# Patient Record
Sex: Female | Born: 1995 | Race: White | Hispanic: No | Marital: Single | Smoking: Never smoker
Health system: Southern US, Community
[De-identification: ages and names within clinical notes are randomized; demographics above are authoritative.]

## PROBLEM LIST (undated history)

## (undated) DIAGNOSIS — I10 Essential (primary) hypertension: Secondary | ICD-10-CM

## (undated) DIAGNOSIS — R768 Other specified abnormal immunological findings in serum: Secondary | ICD-10-CM

## (undated) DIAGNOSIS — G43909 Migraine, unspecified, not intractable, without status migrainosus: Secondary | ICD-10-CM

## (undated) DIAGNOSIS — E669 Obesity, unspecified: Secondary | ICD-10-CM

## (undated) DIAGNOSIS — M797 Fibromyalgia: Secondary | ICD-10-CM

## (undated) HISTORY — DX: Essential (primary) hypertension: I10

## (undated) HISTORY — DX: Migraine, unspecified, not intractable, without status migrainosus: G43.909

## (undated) HISTORY — DX: Fibromyalgia: M79.7

## (undated) HISTORY — DX: Obesity, unspecified: E66.9

## (undated) HISTORY — PX: WISDOM TOOTH EXTRACTION: SHX21

## (undated) HISTORY — DX: Other specified abnormal immunological findings in serum: R76.8

---
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
## 2015-03-20 ENCOUNTER — Encounter: Payer: Self-pay | Admitting: Obstetrics & Gynecology

## 2015-03-20 ENCOUNTER — Ambulatory Visit (INDEPENDENT_AMBULATORY_CARE_PROVIDER_SITE_OTHER): Payer: BC Managed Care – PPO | Admitting: Obstetrics & Gynecology

## 2015-03-20 VITALS — BP 124/60 | HR 64 | Resp 12 | Ht 67.5 in | Wt 207.0 lb

## 2015-03-20 DIAGNOSIS — G43119 Migraine with aura, intractable, without status migrainosus: Secondary | ICD-10-CM

## 2015-03-20 DIAGNOSIS — N938 Other specified abnormal uterine and vaginal bleeding: Secondary | ICD-10-CM | POA: Diagnosis not present

## 2015-03-20 DIAGNOSIS — G43109 Migraine with aura, not intractable, without status migrainosus: Secondary | ICD-10-CM | POA: Insufficient documentation

## 2015-03-20 DIAGNOSIS — N946 Dysmenorrhea, unspecified: Secondary | ICD-10-CM | POA: Diagnosis not present

## 2015-03-20 MED ORDER — NORETHINDRONE 0.35 MG PO TABS: 1.0000 | ORAL_TABLET | Freq: Every day | ORAL | Status: DC

## 2015-03-20 NOTE — Progress Notes (Addendum)
19 y.o. G0P0000 SingleCaucasianF here for new patient visit.  I see pt's mother.  Cycles started around age 36.  Cycles have never been regular.  Reports they are better but still not regular.  In the last year, has not gone much longer than 4-5 weeks but sometimes can be about three weeks apart.  Flow is always five days.  First two days are more heavy and then it lightens.  Sometimes will pass clots.  Does have a lot of cramping.    Does have h/o migraines with aura.  Does have visual and sound aura.  Has seen a neurologist at [facility].  Dr. Ulysees Barns.  Accessed Care Everywhere records.  Has 5-7 severe days monthly.  Did have MRI/MRV/MRA for evaluation.  Has done the Gardisil vaccine series.    Home for the summer from [place] in [place].    Patient's last menstrual period was 03/19/2015.          Sexually active: No. never sexually active The current method of family planning is none.    Exercising: Yes.     Smoker:  no  Health Maintenance: Pap:  none History of abnormal Pap:  no MMG:  none Colonoscopy:  none BMD:   none TDaP: UTD Screening Labs: n/a, Hb today: n/a, Urine today: n/a   reports that she has never smoked. She has never used smokeless tobacco. She reports that she does not drink alcohol or use illicit drugs.  Past Medical History  Diagnosis Date  . Migraines     will occ have aura-  . Hypertension     h/o-no medication needed    Past Surgical History  Procedure Laterality Date  . Wisdom tooth extraction      Current Outpatient Prescriptions  Medication Sig Dispense Refill  . baclofen (LIORESAL) 20 MG tablet     . LamoTRIgine (LAMICTAL PO) Take by mouth.  daily    . Magnesium 250 MG TABS Take by mouth daily.    Marland Kitchen VITAMIN D, CHOLECALCIFEROL, PO Take 800 Int'l Units by mouth.     No current facility-administered medications for this visit.    Family History  Problem Relation Age of Onset  . Breast cancer Other     maternal cousins  . Pancreatic  cancer Other     maternal side  . Ovarian cancer Other     maternal side  . Hypertension Mother     and maternal side  . Hypertension Father     and paternal side  . Diabetes Mother     ? if diag or pre-diabetes  . Gestational diabetes Mother   . Diabetes Maternal Grandmother   . Stroke Maternal Grandfather   . Stroke Paternal Grandmother   . Migraines Father   . Migraines Mother     as teenager  . Asthma Brother     sports induced when younger    ROS:  Pertinent items are noted in HPI.  Otherwise, a comprehensive ROS was negative.  Exam:   BP 124/60 mmHg  Pulse 64  Resp 12  Ht 5' 7.5" (1.715 m)  Wt 207 lb (93.895 kg)  BMI 31.92 kg/m2  LMP 03/19/2015   Height: 5' 7.5" (171.5 cm)  Ht Readings from Last 3 Encounters:  03/20/15 5' 7.5" (1.715 m) (90 %*, Z = 1.26)   * Growth percentiles are based on CDC 2-20 Years data.   General appearance: alert, cooperative and appears stated age Head: Normocephalic, without obvious abnormality, atraumatic Neck: no adenopathy,  supple, symmetrical, trachea midline and thyroid normal to inspection and palpation Lungs: clear to auscultation bilaterally Heart: regular rate and rhythm Skin: facial Neurologic: Grossly normal  A:  DUB Dysmenorrhea Migraines with Aura Possible PCOS Acne  P:   Trial of micronor daily.  rx for 1 month with 3RF. Return for fasting insulin, testosterone, and FSH/LH.  May need PUS. Follow up three months

## 2015-03-21 ENCOUNTER — Other Ambulatory Visit (INDEPENDENT_AMBULATORY_CARE_PROVIDER_SITE_OTHER): Payer: BC Managed Care – PPO

## 2015-03-21 DIAGNOSIS — N938 Other specified abnormal uterine and vaginal bleeding: Secondary | ICD-10-CM

## 2015-03-22 LAB — INSULIN, FASTING: INSULIN FASTING, SERUM: 7.6 u[IU]/mL (ref 2.0–19.6)

## 2015-03-22 LAB — FSH/LH
FSH: 3.9 m[IU]/mL
LH: 2.7 m[IU]/mL

## 2015-03-22 LAB — TESTOSTERONE: Testosterone: 35 ng/dL (ref 15–40)

## 2015-04-03 ENCOUNTER — Telehealth: Payer: Self-pay

## 2015-04-03 NOTE — Telephone Encounter (Signed)
-----   Message from Jerene Bears, MD sent at 04/03/2015  6:28 AM EDT ----- Inform pt FSH/LH ratio and values are in normal range.  Tesosterone level normal.  Fasting insulin normal.  Labs do not support diagnosis of PCOS.  She was started on micronor at last visit and should have three month recheck scheduled.  Do not feel needs PUS at this time.

## 2015-04-03 NOTE — Telephone Encounter (Signed)
Left detailed message, ok per DPR, on voicemail. Advised patient that PUS is not needed at this time, but to keep 3 month follow up with Dr Hyacinth Meeker and to call back to go over all lab results.//kn

## 2015-04-11 NOTE — Telephone Encounter (Signed)
Returned call

## 2015-04-14 NOTE — Telephone Encounter (Signed)
Patient notified of all results. See result note.//kn 

## 2015-05-05 ENCOUNTER — Telehealth: Payer: Self-pay | Admitting: Obstetrics & Gynecology

## 2015-05-05 NOTE — Telephone Encounter (Signed)
Pt requesting rescheduling of her 3 month recheck with Dr Hyacinth MeekerMiller. She attends college in [place] and her start date was moved up. Pt needs appointment before 06/04/15. As I am unable to see open appointments prior to that date, pt agreeable to return call from triage.

## 2015-05-05 NOTE — Telephone Encounter (Signed)
Addition to previous note: appointment cancelled and ready for rescheduling.

## 2015-05-05 NOTE — Telephone Encounter (Signed)
Spoke with patient. Patient will return to school 8/17. 3 month recheck appointment moved to 05/22/2015 at 11:15am with Dr.Miller. Patient is agreeable to date and time.  Routing to provider for final review. Patient agreeable to disposition. Will close encounter.   Patient aware provider will review message and nurse will return call if any additional advice or change of disposition.

## 2015-05-22 ENCOUNTER — Encounter: Payer: Self-pay | Admitting: Obstetrics & Gynecology

## 2015-05-22 ENCOUNTER — Ambulatory Visit (INDEPENDENT_AMBULATORY_CARE_PROVIDER_SITE_OTHER): Payer: BC Managed Care – PPO | Admitting: Obstetrics & Gynecology

## 2015-05-22 VITALS — BP 120/78 | HR 82 | Resp 14 | Wt 218.6 lb

## 2015-05-22 DIAGNOSIS — N938 Other specified abnormal uterine and vaginal bleeding: Secondary | ICD-10-CM

## 2015-05-22 MED ORDER — NORETHINDRONE 0.35 MG PO TABS: 1.0000 | ORAL_TABLET | Freq: Every day | ORAL | Status: DC

## 2015-05-22 NOTE — Progress Notes (Signed)
19 y.o. G0P0000 Single CaucasianF here for follow-up after starting Camilla.  Last cycle was 7 days which is longer than normal for her.  First two days were heavy.  Also, she spotted for about a week with a brownish discharge before this cycle started as well.    Pt, otherwise, is without side effects.  Feels headaches are better.    D/w pt options for her including depo provera, Nexplanon, IUD use.  As she is not SA, does not need contraception.  Pt is not a candidate for OCPs with estrogen due to her significant migraines with aura.    Assessment:  DUB, not significantly improved on Micronor  Plan:  Pt desires to stay on current method for another few months.  Rx given to pt as she is going back to school in [place] in the next two weeks.  She will give update in another two months.  If desires change of method, will do this when she returns for Christmas break.

## 2015-06-06 ENCOUNTER — Ambulatory Visit: Payer: BC Managed Care – PPO | Admitting: Obstetrics & Gynecology

## 2015-10-24 DIAGNOSIS — S32000A Wedge compression fracture of unspecified lumbar vertebra, initial encounter for closed fracture: Secondary | ICD-10-CM | POA: Insufficient documentation

## 2015-11-28 DIAGNOSIS — Z9889 Other specified postprocedural states: Secondary | ICD-10-CM | POA: Insufficient documentation

## 2015-12-01 HISTORY — PX: BACK SURGERY: SHX140

## 2016-04-27 ENCOUNTER — Telehealth: Payer: Self-pay

## 2016-04-27 ENCOUNTER — Ambulatory Visit (INDEPENDENT_AMBULATORY_CARE_PROVIDER_SITE_OTHER): Payer: BC Managed Care – PPO | Admitting: Obstetrics & Gynecology

## 2016-04-27 ENCOUNTER — Encounter: Payer: Self-pay | Admitting: Obstetrics & Gynecology

## 2016-04-27 VITALS — BP 116/62 | HR 96 | Resp 14 | Ht 68.0 in | Wt 236.2 lb

## 2016-04-27 DIAGNOSIS — Z3009 Encounter for other general counseling and advice on contraception: Secondary | ICD-10-CM | POA: Diagnosis not present

## 2016-04-27 DIAGNOSIS — N946 Dysmenorrhea, unspecified: Secondary | ICD-10-CM | POA: Diagnosis not present

## 2016-04-27 MED ORDER — MISOPROSTOL 200 MCG PO TABS: ORAL_TABLET | ORAL | Status: DC

## 2016-04-27 MED ORDER — DIAZEPAM 5 MG PO TABS
ORAL_TABLET | ORAL | Status: DC
Start: 2016-04-27 — End: 2016-10-15

## 2016-04-27 NOTE — Progress Notes (Signed)
GYNECOLOGY  VISIT   HPI: 20 y.o. 360P0000 Single Caucasian female here to discuss current OCPs.  Pt has been micronor and, although this has helped, pt would like to consider other options.  She is not SA but desires contraception with whatever option she chooses.  Depo Provera, nexplanon, IUD use reviewed.  Do not feel combination OCPs are a good option due to headache hx--migraines with aura.  [facility].  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  PT really helped.  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  Back started to bother her again.  Had surgery five months ago.  Doing well.  GYNECOLOGIC HISTORY: Patient's last menstrual period was 04/08/2016. Contraception: not SA but also desires contraception with method to improve bleeding.  Patient Active Problem List   Diagnosis Date Noted  . DUB (dysfunctional uterine bleeding) 05/22/2015  . Migraine with aura 03/20/2015  . Headache, migraine 10/21/2014    Past Medical History  Diagnosis Date  . Migraines     will occ have aura-  . Hypertension     h/o-no medication needed    Past Surgical History  Procedure Laterality Date  . Wisdom tooth extraction    . Back surgery  12/01/2015     [facility]     MEDS:  Reviewed in EPIC and UTD  ALLERGIES: Cefzil and Flagyl  Family History  Problem Relation Age of Onset  . Breast cancer Other     maternal cousins  . Pancreatic cancer Other     maternal side  . Ovarian cancer Other     maternal side  . Hypertension Mother     and maternal side  . Hypertension Father     and paternal side  . Diabetes Mother     ? if diag or pre-diabetes  . Gestational diabetes Mother   . Diabetes Maternal Grandmother   . Stroke Maternal Grandfather   . Stroke Paternal Grandmother   . Migraines Father   . Migraines Mother     as teenager  . Asthma Brother     sports induced when younger    SH:  Single, non  smoker  Review of Systems  All other systems reviewed and are negative.   PHYSICAL EXAMINATION:    BP 116/62 mmHg  Pulse 96  Resp 14  Ht 5\' 8"  (1.727 m)  Wt 236 lb 3.2 oz (107.14 kg)  BMI 35.92 kg/m2  LMP 04/08/2016    General appearance: alert, cooperative and appears stated age Neck: no adenopathy, supple, symmetrical, trachea midline and thyroid normal to inspection and palpation CV:  Regular rate and rhythm Lungs:  clear to auscultation, no wheezes, rales or rhonchi, symmetric air entry Abdomen: soft, non-tender; bowel sounds normal; no masses,  no organomegaly  Pelvic:  No exam performed  Chaperone was present for exam.  Assessment: Dysmenorrhea H/O migraines with aura, cannot use combination OCPs  Plan: Return for PUS and Kyleena placement same day, if possible.  Rx for Cytotec 200mcg to be placed PV pm and am before procedure.  Valium rx for 5mg  po x 1, 60 minutes prior to procedure given.   ~20 minutes spent with patient >50% of time was in face to face discussion of above.

## 2016-04-27 NOTE — Patient Instructions (Signed)
Take 800mg  (four tabs) ibuprofen/motrin/advil 60 minutes before procedure.  You can take a valium if you want, 30 minutes to an hour before procedure.

## 2016-04-27 NOTE — Telephone Encounter (Signed)
Spoke with patient. Patient is ready to schedule her ultrasound guided IUD insertion with Dr.Miller. Patient was seen in the office today with Dr.Miller please see OV note. Ultrasound guided IUD insertion scheduled for 05/13/2016 at 1:30-2 pm. Patient is agreeable to date and time. Pre procedure instructions given.  Motrin instructions given. Motrin=Advil=Ibuprofen, 800 mg one hour before appointment. Eat a meal and hydrate well before appointment. Cytotec instructions given. Place 1 tablet PV night before the procedure. Place 1 tablet PV the morning of the procedure. Cytotec rx was previously sent to pharmacy by Dr.Miller.   Routing to provider for final review. Patient agreeable to disposition. Will close encounter.

## 2016-05-05 ENCOUNTER — Telehealth: Payer: Self-pay | Admitting: Behavioral Health

## 2016-05-05 ENCOUNTER — Encounter: Payer: Self-pay | Admitting: Behavioral Health

## 2016-05-05 NOTE — Telephone Encounter (Signed)
Unable to reach patient at time of Pre-Visit Call.  Left message for patient to return call when available.    

## 2016-05-05 NOTE — Addendum Note (Signed)
Addended by: Harold BarbanBYRD, RONECIA E on: 05/05/2016 11:25 AM   Modules accepted: Orders, Medications

## 2016-05-05 NOTE — Telephone Encounter (Signed)
Pre-Visit Call completed with patient and chart updated.   Pre-Visit Info documented in Specialty Comments under SnapShot.    

## 2016-05-06 ENCOUNTER — Ambulatory Visit (INDEPENDENT_AMBULATORY_CARE_PROVIDER_SITE_OTHER): Payer: BC Managed Care – PPO | Admitting: Medical

## 2016-05-06 ENCOUNTER — Encounter: Payer: Self-pay | Admitting: Medical

## 2016-05-06 VITALS — BP 113/59 | HR 87 | Temp 98.2°F | Resp 16 | Ht 68.0 in | Wt 232.8 lb

## 2016-05-06 DIAGNOSIS — R5383 Other fatigue: Secondary | ICD-10-CM

## 2016-05-06 DIAGNOSIS — G43809 Other migraine, not intractable, without status migrainosus: Secondary | ICD-10-CM | POA: Diagnosis not present

## 2016-05-06 DIAGNOSIS — G4700 Insomnia, unspecified: Secondary | ICD-10-CM

## 2016-05-06 DIAGNOSIS — M545 Low back pain: Secondary | ICD-10-CM

## 2016-05-06 DIAGNOSIS — N938 Other specified abnormal uterine and vaginal bleeding: Secondary | ICD-10-CM | POA: Diagnosis not present

## 2016-05-06 LAB — COMPREHENSIVE METABOLIC PANEL
ALBUMIN: 4.6 g/dL (ref 3.5–5.2)
ALT: 20 U/L (ref 0–35)
AST: 17 U/L (ref 0–37)
Alkaline Phosphatase: 74 U/L (ref 39–117)
BILIRUBIN TOTAL: 0.4 mg/dL (ref 0.2–1.2)
BUN: 12 mg/dL (ref 6–23)
CALCIUM: 9.7 mg/dL (ref 8.4–10.5)
CHLORIDE: 105 meq/L (ref 96–112)
CO2: 24 mEq/L (ref 19–32)
CREATININE: 0.72 mg/dL (ref 0.40–1.20)
GFR: 109.18 mL/min (ref >60.00)
Glucose, Bld: 91 mg/dL (ref 70–99)
Potassium: 4.5 mEq/L (ref 3.5–5.1)
Sodium: 139 mEq/L (ref 135–145)
Total Protein: 7.7 g/dL (ref 6.0–8.3)

## 2016-05-06 LAB — TSH: TSH: 0.71 u[IU]/mL (ref 0.35–5.50)

## 2016-05-06 LAB — CBC WITH DIFFERENTIAL/PLATELET
BASOS PCT: 0.3 % (ref 0.0–3.0)
Basophils Absolute: 0 10*3/uL (ref 0.0–0.1)
EOS ABS: 0 10*3/uL (ref 0.0–0.7)
Eosinophils Relative: 0.5 % (ref 0.0–5.0)
HEMATOCRIT: 42.2 % (ref 36.0–46.0)
HEMOGLOBIN: 14.4 g/dL (ref 12.0–15.0)
LYMPHS PCT: 12.9 % (ref 12.0–46.0)
Lymphs Abs: 1.2 10*3/uL (ref 0.7–4.0)
MCHC: 34.2 g/dL (ref 30.0–36.0)
MCV: 86.2 fl (ref 78.0–100.0)
MONOS PCT: 4.9 % (ref 3.0–12.0)
Monocytes Absolute: 0.5 10*3/uL (ref 0.1–1.0)
Neutro Abs: 7.5 10*3/uL (ref 1.4–7.7)
Neutrophils Relative %: 81.4 % — ABNORMAL HIGH (ref 43.0–77.0)
Platelets: 286 10*3/uL (ref 150.0–400.0)
RBC: 4.89 Mil/uL (ref 3.87–5.11)
RDW: 13.5 % (ref 11.5–14.6)
WBC: 9.2 10*3/uL (ref 4.5–10.5)

## 2016-05-06 LAB — T4: T4 TOTAL: 7.7 ug/dL (ref 4.5–12.0)

## 2016-05-06 MED ORDER — ZOLPIDEM TARTRATE 5 MG PO TABS: 5.0000 mg | ORAL_TABLET | Freq: Every evening | ORAL | Status: DC | PRN

## 2016-05-06 NOTE — Progress Notes (Signed)
Pre visit review using our clinic review tool, if applicable. No additional management support is needed unless otherwise documented below in the visit note. 

## 2016-05-06 NOTE — Patient Instructions (Addendum)
For your migraine ha continue to follow up with neurologist and follow their treatment regimen.  For dysfunctional uterine bleeding see gyn and follow their treatment.  For occasional insomnia will rx low dose ambien limited number in event insomnia returns.  For fatigue get cbc, cmp, tsh and t4 studies.   We will call you with results of labs.  Your back pain appears much improved post surgery  Follow up when back in town from [place]. Strongly recommend establishing primary care their as well since many conditions need physical exam and in office treatment. Example being toradol injection for severe persistent migraine.

## 2016-05-06 NOTE — Progress Notes (Addendum)
Subjective:    Patient ID: Erica Aguirre, female    DOB: 05/27/1996, 20 y.o.   MRN: [id number]  HPI   I have reviewed pt PMH, PSH, FH, Social History and Surgical History  Pt is a Consulting civil engineerstudent. She attends [place](pt degree in biology and looking to go to med school). Pt will head back their in 3 weeks. Pt has not been exercising due to back surgery that she is recovering from that was done in feb. Some occasional caffeine beverage about 3 times a month. Pt states eats healthy Single.   LMP- Currently.  Pt states she saw neurologist last week. She get botox every 3 months for her migraines. She states these are helping control severity of HA. Daily ha. Low level and no associated neurologic signs or symptoms.   Pt states she has no primary care in [place]. She states she has schedule of going to school for 4 months then 1 month off. She goes to very small school.   Pt after her surgery. She had some insomnia. Since March and April  Able to sleep. She still has [place]. She uses very sparingly right now.  Pt having iud next week placed. Gyn gave valium before procedure.  Pt had history of back pain in past. Rx of l4 since 2015. Pt had surgery and she reports recent surgery was succesful.(fusion l3-l5).  Pt told MA she wants thyroid testing done. She states always feels cold even if hot outside. Pt states years since.  Pt thinks maybe fatigued. She thinks has grown use to be tired.     Review of Systems  Constitutional: Positive for fatigue. Negative for fever and chills.  Respiratory: Negative for cough, chest tightness, shortness of breath and wheezing.   Cardiovascular: Negative for chest pain and palpitations.  Gastrointestinal: Negative for nausea, vomiting and abdominal pain.  Musculoskeletal: Negative for back pain.  Neurological: Positive for headaches. Negative for dizziness.       Pt states daily ha. Sees neurologst. When she was first worked up by them had imaging studies.    Hematological: Negative for adenopathy. Does not bruise/bleed easily.  Psychiatric/Behavioral: Negative for behavioral problems.    Past Medical History  Diagnosis Date  . Migraines     will occ have aura-  . Hypertension     h/o-no medication needed     Social History   Social History  . Marital Status: Single    Spouse Name: N/A  . Number of Children: N/A  . Years of Education: N/A   Occupational History  . Not on file.   Social History Main Topics  . Smoking status: Never Smoker   . Smokeless tobacco: Never Used  . Alcohol Use: No  . Drug Use: No  . Sexual Activity: No   Other Topics Concern  . Not on file   Social History Narrative    Past Surgical History  Procedure Laterality Date  . Wisdom tooth extraction    . Back surgery  12/01/2015     [facility]     Family History  Problem Relation Age of Onset  . Breast cancer Other     maternal cousins  . Pancreatic cancer Other     maternal side  . Ovarian cancer Other     maternal side  . Hypertension Mother     and maternal side  . Hypertension Father     and paternal side  . Diabetes Mother     ?  if diag or pre-diabetes  . Gestational diabetes Mother   . Diabetes Maternal Grandmother   . Stroke Maternal Grandfather   . Stroke Paternal Grandmother   . Migraines Father   . Migraines Mother     as teenager  . Asthma Brother     sports induced when younger    Allergies  Allergen Reactions  . Cefzil [Cefprozil]     rash  . Flagyl [Metronidazole]     rash    Current Outpatient Prescriptions on File Prior to Visit  Medication Sig Dispense Refill  . acetaminophen (TYLENOL) 325 MG tablet Take by mouth as needed.     . baclofen (LIORESAL) 20 MG tablet     . cyclobenzaprine (FLEXERIL) 10 MG tablet Take 10 mg by mouth daily as needed.    . diazepam (VALIUM) 5 MG tablet Take one tab 30 minutes to one hour before procedure 5 tablet 0  . ibuprofen (ADVIL,MOTRIN) 200 MG tablet  Take by mouth as needed.     . LamoTRIgine (LAMICTAL PO) Take by mouth.  daily    . magnesium gluconate (MAGONATE) 500 MG tablet Take 500 mg by mouth daily.    . misoprostol (CYTOTEC) 200 MCG tablet Place tab pv pm and am before procedure 2 tablet 0  . Multiple Vitamin (MULTIVITAMIN) tablet Take 1 tablet by mouth daily.    . norethindrone (MICRONOR,CAMILA,ERRIN) 0.35 MG tablet Take 1 tablet (0.35 mg total) by mouth daily. 1 Package 12  . VITAMIN D, CHOLECALCIFEROL, PO Take 800 Int'l Units by mouth.    . zolpidem (AMBIEN) 5 MG tablet Take 5 mg by mouth as needed.      No current facility-administered medications on file prior to visit.    BP 113/59 mmHg  Pulse 87  Temp(Src) 98.2 F (36.8 C) (Oral)  Resp 16  Ht  (1.727 m)  Wt 232 lb 12.8 oz (105.597 kg)  BMI 35.41 kg/m2  SpO2 98%  LMP 04/08/2016       Objective:   Physical Exam   General Mental Status- Alert. General Appearance- Not in acute distress.   Skin General: Color- Normal Color. Moisture- Normal Moisture.  Neck Carotid Arteries- Normal color. Moisture- Normal Moisture. No carotid bruits. No JVD.  Chest and Lung Exam Auscultation: Breath Sounds:-Normal.  Cardiovascular Auscultation:Rythm- Regular. Murmurs & Other Heart Sounds:Auscultation of the heart reveals- No Murmurs.  Abdomen Inspection:-Inspeection Normal. Palpation/Percussion:Note:No mass. Palpation and Percussion of the abdomen reveal- Non Tender, Non Distended + BS, no rebound or guarding.   Neurologic Cranial Nerve exam:- CN III-XII intact(No nystagmus), symmetric smile. Strength:- 5/5 equal and symmetric strength both upper and lower extremities.     Assessment & Plan:  For your migraine ha continue to follow up with neurologist and follow their treatment regimen.  For dysfunctional uterine bleeding see gyn and follow their treatment.  For occasional insomnia will rx low dose ambien limited number in event insomnia  returns.  For fatigue get cbc, cmp, tsh and t4 studies.   We will call you with results of labs.  Your back pain appears much improved post surgery  Follow up when back in town from [place]. Strongly recommend establishing primary care their as well since many conditions need physical exam and in office treatment. Example being toradol injection for severe persistent migraine.  Pt noted in 3 wks head back to [place].  Ayden Hardwick, Ramon Dredge, PA-C

## 2016-05-11 NOTE — Progress Notes (Signed)
Pt has seen results on MyChart and message also sent for patient to call back if any questions.

## 2016-05-13 ENCOUNTER — Ambulatory Visit (INDEPENDENT_AMBULATORY_CARE_PROVIDER_SITE_OTHER): Payer: BC Managed Care – PPO

## 2016-05-13 ENCOUNTER — Ambulatory Visit (INDEPENDENT_AMBULATORY_CARE_PROVIDER_SITE_OTHER): Payer: BC Managed Care – PPO | Admitting: Obstetrics & Gynecology

## 2016-05-13 VITALS — BP 116/70 | HR 88

## 2016-05-13 DIAGNOSIS — Z30430 Encounter for insertion of intrauterine contraceptive device: Secondary | ICD-10-CM

## 2016-05-13 DIAGNOSIS — Z3009 Encounter for other general counseling and advice on contraception: Secondary | ICD-10-CM

## 2016-05-13 DIAGNOSIS — N946 Dysmenorrhea, unspecified: Secondary | ICD-10-CM

## 2016-05-13 DIAGNOSIS — N938 Other specified abnormal uterine and vaginal bleeding: Secondary | ICD-10-CM | POA: Diagnosis not present

## 2016-05-13 NOTE — Progress Notes (Signed)
20 y.o. G60P0000 Single Caucasian female here for pelvic ultrasound due to dysmenorrhea and irregular bleeding.  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  Had planned to do this after ultrasound as long as findings are normal.  Pt with hx of migraine with aura.  Pt needs something for cycle control and for possible future contraception needs  LMP:  05/08/16.   Contraception:  Not currently SA  Findings:  UTERUS: 8.4 x 3.6 x 3.2cm EMS: 3.29mm ADNEXA: Left ovary:  2.6 x 1.6 x 1.4cm       Right ovary: 2.6 x 1.6 x 1.7cm CUL DE SAC: no free fluid noted  Discussion:  With normal appearing ultrasound, pt (who is accompanied by her mother) would like IUD placement.  We had previously discussed the [facility] IUD.  She has been counseled about other options for cycle and birth control.  Due to migraines with aura, I do not think estrogen options should be used.  Pt has been counseled about risks and benefits as well as complications.  Consent is obtained today.  All questions answered prior to start of procedure.    ROS  Gen:  WNWF healthy female NAD Abdomen: soft, non-tender Groin:  no inguinal nodes palpated  Pelvic exam: Vulva:  normal female genitalia Vagina:  normal vagina Cervix:  Non-tender, Negative CMT, no lesions or redness. Uterus:  normal shape, position and consistency   Procedure:  Speculum reinserted.  Cervix visualized and cleansed with Betadine x 3.  Paracervical block was not placed.  Single toothed tenaculum applied to anterior lip of cervix without difficulty.  Uterus sounded to 8cm.  IUD package was opened.  IUD and introducer passed to fundus and then withdrawn slightly before IUD was passed into endometrial cavity.  Introducer removed.  Strings cut to 2cm.  Tenaculum removed from cervix.  Minimal bleeding noted.  Pt tolerated the procedure well.  All instruments removed from vagina.   Post procedure examination with ultrasound shows  correct placement  Assessment:  Dysmenorrhea Migraines with aura Placement of Kyleena IUD today  Plan:  Pt will be back in [place] in 6-8 weeks so she knows to call with any concerns over the next few weeks before she goes back to school. Will plan to see her in the fall or winter when she is home for break.  She will need to call and schedule this. Aware IUD removal is due no later than 05/13/2021.  ~15 minutes spent with patient >50% of time was in face to face discussion of ultrasound findings and discussion of options with her mother before proceeding with IUD placement.

## 2016-06-29 ENCOUNTER — Encounter: Payer: Self-pay | Admitting: Medical

## 2016-10-14 NOTE — Progress Notes (Signed)
20 y.o. G0P0000 SingleCaucasianF here for annual exam.  Still having problems with migraines.  [facility].  Has been doing Botox with limited success this year.    [facility].  This should be the last appt related to surgery but will see Dr. Cameron Aliavanaugh on a yearly basis going forward.    Reports cycles are fairly regular.  Last one was about two weeks late.  Pain with cycles are better.  Flow usually lasts four days with the second one being heavy.  Had Kyleena placed 05/13/16.  Patient's last menstrual period was 10/13/2016.          Sexually active: No.  The current method of family planning is IUD.    Exercising: No.  no regular exercise Smoker:  no  Health Maintenance: Pap:  never History of abnormal Pap:  n/a MMG:  never Colonoscopy:  never BMD:   never TDaP:  UTD Pneumonia vaccine(s):  n/a Zostavax:   n/a Hep C testing: not indicated  Screening Labs: not done today, Hb today: not done today, Urine today: unable to void   reports that she has never smoked. She has never used smokeless tobacco. She reports that she does not drink alcohol or use drugs.  Past Medical History:  Diagnosis Date  . Hypertension    h/o-no medication needed  . Migraines    will occ have aura-    Past Surgical History:  Procedure Laterality Date  . BACK SURGERY  12/01/2015    [facility]   . WISDOM TOOTH EXTRACTION      Current Outpatient Prescriptions  Medication Sig Dispense Refill  . acetaminophen (TYLENOL) 325 MG tablet Take by mouth as needed.     . baclofen (LIORESAL) 20 MG tablet     . cyclobenzaprine (FLEXERIL) 10 MG tablet Take 10 mg by mouth daily as needed.    . gabapentin (NEURONTIN) 300 MG capsule Take 300 mg by mouth as needed.    Marland Kitchen. ibuprofen (ADVIL,MOTRIN) 200 MG tablet Take by mouth as needed.     . lamoTRIgine (LAMICTAL) 25 MG tablet Take 3 tablets by mouth daily. 20mg  daily     . magnesium gluconate (MAGONATE)  500 MG tablet Take 500 mg by mouth daily.    . Multiple Vitamin (MULTIVITAMIN) tablet Take 1 tablet by mouth daily.    . predniSONE (DELTASONE) 10 MG tablet Take 1 tablet by mouth daily. For 10 days    . VITAMIN D, CHOLECALCIFEROL, PO Take 800 Int'l Units by mouth.    . zolpidem (AMBIEN) 5 MG tablet Take 1 tablet (5 mg total) by mouth at bedtime as needed. 14 tablet 0   No current facility-administered medications for this visit.     Family History  Problem Relation Age of Onset  . Breast cancer Other     maternal cousins  . Pancreatic cancer Other     maternal side  . Ovarian cancer Other     maternal side  . Hypertension Mother     and maternal side  . Diabetes Mother     ? if diag or pre-diabetes  . Gestational diabetes Mother   . Migraines Mother     as teenager  . Hypertension Father     and paternal side  . Migraines Father   . Diabetes Maternal Grandmother   . Stroke Maternal Grandfather   . Stroke Paternal Grandmother   . Asthma Brother  sports induced when younger    ROS:  Pertinent items are noted in HPI.  Otherwise, a comprehensive ROS was negative.  Exam:   BP 122/70 (BP Location: Right Arm, Patient Position: Sitting, Cuff Size: Normal)   Pulse 92   Resp 16   Ht 5\' 8"  (1.727 m)   Wt 241 lb (109.3 kg)   LMP 10/13/2016   BMI 36.64 kg/m   Weight change: +8#   Height: 5\' 8"  (172.7 cm)  Ht Readings from Last 3 Encounters:  10/15/16 5\' 8"  (1.727 m)  05/06/16 5\' 8"  (1.727 m)  04/27/16 5\' 8"  (1.727 m)    General appearance: alert, cooperative and appears stated age Head: Normocephalic, without obvious abnormality, atraumatic Neck: no adenopathy, supple, symmetrical, trachea midline and thyroid normal to inspection and palpation Lungs: clear to auscultation bilaterally Breasts: normal appearance, no masses or tenderness Heart: regular rate and rhythm Abdomen: soft, non-tender; bowel sounds normal; no masses,  no organomegaly Extremities: extremities  normal, atraumatic, no cyanosis or edema Skin: Skin color, texture, turgor normal. No rashes or lesions Lymph nodes: Cervical, supraclavicular, and axillary nodes normal. No abnormal inguinal nodes palpated Neurologic: Grossly normal  Pelvic: External genitalia:  no lesions              Urethra:  normal appearing urethra with no masses, tenderness or lesions              Bartholins and Skenes: normal                 Vagina: normal appearing vagina with normal color and discharge, no lesions              Cervix: no lesions, IUD string noted              Pap taken: Yes.   Bimanual Exam:  Uterus:  normal size, contour, position, consistency, mobility, non-tender              Adnexa: normal adnexa and no mass, fullness, tenderness               Rectovaginal: Confirms               Anus:  normal sphincter tone, no lesions  Chaperone was present for exam.  A:  Well woman with normal exam IUD use for h/o dysmenorrhea.  Currently not SA. Migraines  P:   Mammogram guidelines reviewed pap smear obtained today Kyleena IUD placed 05/10/16 Seeing ortho yearly Has [facility] Return annually or prn

## 2016-10-15 ENCOUNTER — Ambulatory Visit (INDEPENDENT_AMBULATORY_CARE_PROVIDER_SITE_OTHER): Payer: BC Managed Care – PPO | Admitting: Obstetrics & Gynecology

## 2016-10-15 ENCOUNTER — Encounter: Payer: Self-pay | Admitting: Obstetrics & Gynecology

## 2016-10-15 VITALS — BP 122/70 | HR 92 | Resp 16 | Ht 68.0 in | Wt 241.0 lb

## 2016-10-15 DIAGNOSIS — Z975 Presence of (intrauterine) contraceptive device: Secondary | ICD-10-CM | POA: Insufficient documentation

## 2016-10-15 DIAGNOSIS — Z01419 Encounter for gynecological examination (general) (routine) without abnormal findings: Secondary | ICD-10-CM | POA: Diagnosis not present

## 2016-10-15 DIAGNOSIS — Z124 Encounter for screening for malignant neoplasm of cervix: Secondary | ICD-10-CM | POA: Diagnosis not present

## 2016-10-20 LAB — IPS PAP SMEAR ONLY

## 2017-07-26 ENCOUNTER — Ambulatory Visit (INDEPENDENT_AMBULATORY_CARE_PROVIDER_SITE_OTHER): Payer: BC Managed Care – PPO | Admitting: Medical

## 2017-07-26 ENCOUNTER — Encounter: Payer: Self-pay | Admitting: Medical

## 2017-07-26 VITALS — Ht 68.0 in | Wt 250.0 lb

## 2017-07-26 DIAGNOSIS — R05 Cough: Secondary | ICD-10-CM | POA: Diagnosis not present

## 2017-07-26 DIAGNOSIS — J029 Acute pharyngitis, unspecified: Secondary | ICD-10-CM

## 2017-07-26 DIAGNOSIS — J3089 Other allergic rhinitis: Secondary | ICD-10-CM | POA: Diagnosis not present

## 2017-07-26 DIAGNOSIS — R059 Cough, unspecified: Secondary | ICD-10-CM

## 2017-07-26 LAB — POCT RAPID STREP A (OFFICE): Rapid Strep A Screen: NEGATIVE

## 2017-07-26 MED ORDER — BENZONATATE 100 MG PO CAPS: 100.0000 mg | ORAL_CAPSULE | Freq: Three times a day (TID) | ORAL | 0 refills | Status: DC | PRN

## 2017-07-26 MED ORDER — AZITHROMYCIN 250 MG PO TABS: ORAL_TABLET | ORAL | 0 refills | Status: DC

## 2017-07-26 MED ORDER — LEVOCETIRIZINE DIHYDROCHLORIDE 5 MG PO TABS: 5.0000 mg | ORAL_TABLET | Freq: Every evening | ORAL | 0 refills | Status: DC

## 2017-07-26 MED ORDER — FLUTICASONE PROPIONATE 50 MCG/ACT NA SUSP: 2.0000 | Freq: Every day | NASAL | 1 refills | Status: DC

## 2017-07-26 NOTE — Addendum Note (Signed)
Addended by: Linnell Fulling on: 07/26/2017 01:54 PM   Modules accepted: Orders

## 2017-07-26 NOTE — Patient Instructions (Addendum)
Over the past month, it appears that your nasal congestion, runny nose and sneezing likely related to allergic rhinitis. I will go ahead and prescribe Xyzal antihistamine and Flonase nasal spray.  Your rapid strep test was negative but test can be falsely negative. With your occupation, chills, sweats and physical exam findings I do think it is best to treat you with antibiotic azithromycin. This has good strep coverage.(Some coverage of sinuses as well.)  We'll make benzonatate prescription available for cough as well.  Also on review do recommend that when you get over this recent illness that she flu vaccine.   Follow-up in 7-10 days or as needed.

## 2017-07-26 NOTE — Progress Notes (Signed)
Subjective:    Patient ID: Erica Aguirre, female    DOB: 1996/05/17, 21 y.o.   MRN: [id number]  HPI  Pt in feeling stuffy and runny nose for one month(sneezing over past month). Then this Sunday got st and cough. Pt throat moderate swallowing. Cough is intermittently productive.  Pt has some intermittent chills and sweats but not sure if she had a fever.  No joint pain or muscle pain. 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  LMP- (September 9,2018). Pt states impossible/ indicates not active.     Review of Systems  Constitutional: Negative for chills, fatigue and fever.  HENT: Positive for congestion, postnasal drip, sneezing and sore throat.   Respiratory: Positive for cough. Negative for choking, shortness of breath and wheezing.   Cardiovascular: Negative for chest pain and palpitations.  Gastrointestinal: Negative for abdominal pain, constipation, diarrhea and vomiting.  Musculoskeletal: Negative for back pain, gait problem and neck pain.  Skin: Negative for rash.  Neurological: Negative for dizziness and headaches.  Hematological: Negative for adenopathy. Does not bruise/bleed easily.  Psychiatric/Behavioral: Negative for behavioral problems, confusion, hallucinations and self-injury. The patient is not nervous/anxious and is not hyperactive.     Past Medical History:  Diagnosis Date  . Hypertension    h/o-no medication needed  . Migraines    will occ have aura-     Social History   Social History  . Marital status: Single    Spouse name: N/A  . Number of children: N/A  . Years of education: N/A   Occupational History  . Not on file.   Social History Main Topics  . Smoking status: Never Smoker  . Smokeless tobacco: Never Used  . Alcohol use No  . Drug use: No  . Sexual activity: No   Other Topics Concern  . Not on file   Social History Narrative  . No narrative on file    Past Surgical History:  Procedure Laterality Date   . BACK SURGERY  12/01/2015    L3-L5 fusion done at [facility]   . WISDOM TOOTH EXTRACTION      Family History  Problem Relation Age of Onset  . Breast cancer Other        maternal cousins  . Pancreatic cancer Other        maternal side  . Ovarian cancer Other        maternal side  . Hypertension Mother        and maternal side  . Diabetes Mother        ? if diag or pre-diabetes  . Gestational diabetes Mother   . Migraines Mother        as teenager  . Hypertension Father        and paternal side  . Migraines Father   . Diabetes Maternal Grandmother   . Stroke Maternal Grandfather   . Stroke Paternal Grandmother   . Asthma Brother        sports induced when younger    Allergies  Allergen Reactions  . Cefzil [Cefprozil]     rash  . Flagyl [Metronidazole]     rash    Current Outpatient Prescriptions on File Prior to Visit  Medication Sig Dispense Refill  . acetaminophen (TYLENOL) 325 MG tablet Take by mouth as needed.     . baclofen (LIORESAL) 20 MG tablet     . cyclobenzaprine (FLEXERIL) 10 MG tablet Take 10 mg by mouth daily as needed.    Marland Kitchen  gabapentin (NEURONTIN) 300 MG capsule Take 300 mg by mouth as needed.    Marland Kitchen ibuprofen (ADVIL,MOTRIN) 200 MG tablet Take by mouth as needed.     . lamoTRIgine (LAMICTAL) 25 MG tablet Take 3 tablets by mouth daily.  daily     . magnesium gluconate (MAGONATE) 500 MG tablet Take 500 mg by mouth daily.    . Multiple Vitamin (MULTIVITAMIN) tablet Take 1 tablet by mouth daily.    . predniSONE (DELTASONE) 10 MG tablet Take 1 tablet by mouth daily. For 10 days    . VITAMIN D, CHOLECALCIFEROL, PO Take 800 Int'l Units by mouth.    . zolpidem (AMBIEN) 5 MG tablet Take 1 tablet (5 mg total) by mouth at bedtime as needed. 14 tablet 0   No current facility-administered medications on file prior to visit.     Ht  (1.727 m)   Wt 250 lb (113.4 kg)   LMP 06/26/2017 (Exact Date)   SpO2 97%   BMI 38.01 kg/m       Objective:    Physical Exam  General  Mental Status - Alert. General Appearance - Well groomed. Not in acute distress.  Skin Rashes- No Rashes.  HEENT Head- Normal. Ear Auditory Canal - Left- Normal. Right - Normal.Tympanic Membrane- Left- Normal. Right- Normal. Eye Sclera/Conjunctiva- Left- Normal. Right- Normal. Nose & Sinuses Nasal Mucosa- Left-  Boggy and Congested. Right-  Boggy and  Congested.Bilateral  No maxillary and no frontal sinus pressure. Mouth & Throat Lips: Upper Lip- Normal: no dryness, cracking, pallor, cyanosis, or vesicular eruption. Lower Lip-Normal: no dryness, cracking, pallor, cyanosis or vesicular eruption. Buccal Mucosa- Bilateral- No Aphthous ulcers. Oropharynx- No Discharge or Erythema. Tonsils: Characteristics- Bilateral- faint  Erythema or Congestion. Size/Enlargement- Bilateral- No enlargement. Discharge- bilateral-None.  Neck Neck- Supple. No Masses.   Chest and Lung Exam Auscultation: Breath Sounds:-Clear even and unlabored.  Cardiovascular Auscultation:Rythm- Regular, rate and rhythm. Murmurs & Other Heart Sounds:Ausculatation of the heart reveal- No Murmurs.  Lymphatic Head & Neck General Head & Neck Lymphatics: Bilateral: Description- Faint mild enlarged and tender rt submandibular node.       Assessment & Plan:  Over the past month, it appears that your nasal congestion, runny nose and sneezing likely related to allergic rhinitis. I will go ahead and prescribe Xyzal antihistamine and Flonase nasal spray.  Your rapid strep test was negative but test can be falsely negative. With your occupation, chills, sweats and physical exam findings I do think it is best to treat you with antibiotic azithromycin. This has good strep coverage.(Some coverage of sinuses as well.)  We'll make benzonatate prescription available for cough as well.  Also on review do recommend that when you get over this recent illness that she flu vaccine.   Follow-up in 7-10 days  or as needed.  Winter Jocelyn, Ramon Dredge, PA-C

## 2017-07-27 ENCOUNTER — Ambulatory Visit: Payer: BC Managed Care – PPO | Admitting: Medical

## 2017-07-28 ENCOUNTER — Telehealth: Payer: Self-pay | Admitting: Medical

## 2017-07-28 NOTE — Telephone Encounter (Signed)
Lanier Felty Self [phone]  Anyae called to see if she could possibly get a work note for the rest of the wee, she spike a fever last night and still has it today, and feels it would be best to wait until Monday to go back to work. She works with children.

## 2017-07-28 NOTE — Telephone Encounter (Signed)
Please advise 

## 2017-07-28 NOTE — Telephone Encounter (Signed)
I am okay with her being off until Monday. Would you write that note and I will sign it.

## 2017-07-28 NOTE — Telephone Encounter (Signed)
Pt request work letter be sent to her via email in [place].

## 2017-07-29 NOTE — Telephone Encounter (Signed)
Letter sent to my chart

## 2017-10-04 ENCOUNTER — Ambulatory Visit: Payer: Self-pay
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
## 2017-10-05 ENCOUNTER — Encounter: Payer: Self-pay | Admitting: Medical

## 2017-10-05 ENCOUNTER — Ambulatory Visit: Payer: BC Managed Care – PPO | Admitting: Medical

## 2017-10-05 VITALS — BP 121/73 | HR 89 | Temp 98.2°F | Resp 16 | Ht 68.0 in | Wt 257.6 lb

## 2017-10-05 DIAGNOSIS — R05 Cough: Secondary | ICD-10-CM

## 2017-10-05 DIAGNOSIS — R059 Cough, unspecified: Secondary | ICD-10-CM

## 2017-10-05 MED ORDER — AZELASTINE HCL 0.1 % NA SOLN
2.0000 | Freq: Two times a day (BID) | NASAL | 1 refills | Status: DC
Start: 2017-10-05 — End: 2018-01-04

## 2017-10-05 MED ORDER — BENZONATATE 200 MG PO CAPS: 200.0000 mg | ORAL_CAPSULE | Freq: Three times a day (TID) | ORAL | 0 refills | Status: DC | PRN
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
## 2017-10-05 NOTE — Patient Instructions (Signed)
With your reported dry cough associated with potential smoke exposure and  postnasal drainage on exam, I do think you may have some vasomotor rhinitis.  I do want you to use Astelin nasal spray and I am prescribing benzonatate to suppress cough.  Try to avoid any smoke exposure.  If your symptoms change such as sinus pressure or chest congestion please let me know.  You could my chart me over the holidays and I could send you in an antibiotic if you worsen with bacterial infectious signs or symptoms.    Also is your cough does not improve with above measures would consider getting chest x-ray.  Follow-up in 7-10 days or as needed.

## 2017-10-09 ENCOUNTER — Encounter: Payer: Self-pay | Admitting: Medical

## 2017-10-10 ENCOUNTER — Telehealth: Payer: Self-pay | Admitting: Medical

## 2017-10-10 ENCOUNTER — Telehealth: Payer: Self-pay

## 2017-10-10 MED ORDER — AZITHROMYCIN 250 MG PO TABS: ORAL_TABLET | ORAL | 0 refills | Status: DC

## 2017-10-10 NOTE — Telephone Encounter (Signed)
Patient notified me she was worsening.  Described infectious bronchitis and sinusitis like on my chart.  The day before Christmas and decided to go ahead and send in a azithromycin.

## 2017-10-10 NOTE — Telephone Encounter (Signed)
Prescription of azithromycin sent to patient's pharmacy.

## 2017-10-10 NOTE — Telephone Encounter (Signed)
Copied from CRM [phone]. Topic: General - Other >> Oct 10, 2017  9:39 AM Percival SpanishKennedy, Cheryl W wrote:  Pt call to say Dr Alvira MondaySaguier told her to  call back if she not better and he would call her in a antibiotic  Pharmacy  [facility] [address]

## 2017-10-10 NOTE — Telephone Encounter (Signed)
Relation to pt: self Call back number: [phone]  Pharmacy: [facility] [address] - [place], [place] - [address] [phone] (Phone) [phone])     Reason for call:  Patient last seen 10/05/17 and states PCP advised if symptoms didn't improve PCP would send in antibiotics, patient coughing up phlegm and congested, please advise patient today regarding the status

## 2017-10-12 NOTE — Telephone Encounter (Signed)
Patient picked up rx and is taking, still not feeling much better. I advised her to call us back if she is not feeling better towards the end of rx.

## 2017-10-13 ENCOUNTER — Ambulatory Visit: Payer: BC Managed Care – PPO | Admitting: Family Medicine

## 2017-10-13 ENCOUNTER — Encounter: Payer: Self-pay | Admitting: Family Medicine

## 2017-10-13 ENCOUNTER — Ambulatory Visit (HOSPITAL_BASED_OUTPATIENT_CLINIC_OR_DEPARTMENT_OTHER)
Admission: RE | Admit: 2017-10-13 | Discharge: 2017-10-13 | Disposition: A | Discharge status: Home / Self Care | Payer: BC Managed Care – PPO | Source: Ambulatory Visit | Attending: Family Medicine | Admitting: Family Medicine

## 2017-10-13 VITALS — BP 141/61 | HR 79 | Temp 98.5°F | Ht 68.0 in | Wt 260.0 lb

## 2017-10-13 DIAGNOSIS — R05 Cough: Secondary | ICD-10-CM

## 2017-10-13 DIAGNOSIS — R509 Fever, unspecified: Secondary | ICD-10-CM

## 2017-10-13 DIAGNOSIS — R059 Cough, unspecified: Secondary | ICD-10-CM

## 2017-10-13 MED ORDER — BENZONATATE 100 MG PO CAPS: 100.0000 mg | ORAL_CAPSULE | Freq: Three times a day (TID) | ORAL | 0 refills | Status: DC | PRN

## 2017-10-13 MED ORDER — ALBUTEROL SULFATE HFA 108 (90 BASE) MCG/ACT IN AERS: 2.0000 | INHALATION_SPRAY | Freq: Four times a day (QID) | RESPIRATORY_TRACT | 0 refills | Status: DC | PRN

## 2017-10-13 MED ORDER — HYDROCODONE-HOMATROPINE 5-1.5 MG/5ML PO SYRP: 5.0000 mL | ORAL_SOLUTION | Freq: Three times a day (TID) | ORAL | 0 refills | Status: DC | PRN

## 2017-10-13 NOTE — Progress Notes (Signed)
[facility] [address], [address] [place], [place] [address] [phone] [phone]  Date:  10/13/2017   Name:  Erica HightMarissa Aguirre   DOB:  04/07/1996   MRN:  [id number]  PCP:  Esperanza RichtersSaguier, Edward, PA-C    Chief Complaint: Fever (Onset about 3 to 4 days ago. Running low grade fevers); Cough (Pt has a cough that began about 4 weeks ago that Pt states is progressively getting worse. Dry but now productive); and Nasal Congestion (Onset about 4 days ago. Pt states she's had a stuffy runny nose)   History of Present Illness:  Erica Aguirre is a 21 y.o. very pleasant female patient who presents with the following:  She was here in October with a sore throat-zpack.  She did get well/ back to normal after this  Came back on 12/19-  She thought she might be having a reaction to a fireplace at her job,  She works at [employer] and they have a fireplace there. It seems to smoke a lot and has been irritating her throat. At first the cough was just at work, but then started to become more frequent She noted that she was coughing more over this past weekend and developed a fever on Sunday-  She called in and was given an abx - azithromycin- she will finish this tomorrow   Her main sx are the cough and fever at this time She has continued to run a low grade fever with Tmax 100 over the last few days She has felt hot which is unusual for her- she more often will feel cold compared dto others  She has felt a bit sore but not really achy She is fatigued  She did have a St last week, but this is now resolved She has noted a HA  No vomiting or diarrhea She is generally in good health    Patient Active Problem List   Diagnosis Date Noted  . IUD (intrauterine device) in place 10/15/2016  . Status post lumbar spine operation 11/28/2015  . Closed wedge fracture of lumbar vertebra (HCC) 10/24/2015  . DUB (dysfunctional uterine bleeding) 05/22/2015  . Migraine  with aura 03/20/2015  . Headache, migraine 10/21/2014    Past Medical History:  Diagnosis Date  . Hypertension    h/o-no medication needed  . Migraines    will occ have aura-    Past Surgical History:  Procedure Laterality Date  . BACK SURGERY  12/01/2015    [facility]   . WISDOM TOOTH EXTRACTION      Social History   Tobacco Use  . Smoking status: Never Smoker  . Smokeless tobacco: Never Used  Substance Use Topics  . Alcohol use: No    Alcohol/week: 0.0 oz  . Drug use: No    Family History  Problem Relation Age of Onset  . Breast cancer Other        maternal cousins  . Pancreatic cancer Other        maternal side  . Ovarian cancer Other        maternal side  . Hypertension Mother        and maternal side  . Diabetes Mother        ? if diag or pre-diabetes  . Gestational diabetes Mother   . Migraines Mother        as teenager  . Hypertension Father  and paternal side  . Migraines Father   . Diabetes Maternal Grandmother   . Stroke Maternal Grandfather   . Stroke Paternal Grandmother   . Asthma Brother        sports induced when younger    Allergies  Allergen Reactions  . Cefzil [Cefprozil]     rash  . Flagyl [Metronidazole]     rash    Medication list has been reviewed and updated.  Current Outpatient Medications on File Prior to Visit  Medication Sig Dispense Refill  . acetaminophen (TYLENOL) 325 MG tablet Take by mouth as needed.     Marland Kitchen. azithromycin (ZITHROMAX) 250 MG tablet Take 2 tablets by mouth on day 1, followed by 1 tablet by mouth daily for 4 days. 6 tablet 0  . baclofen (LIORESAL) 20 MG tablet     . benzonatate (TESSALON) 200 MG capsule Take 1 capsule (200 mg total) by mouth 3 (three) times daily as needed for cough. 30 capsule 0  . ibuprofen (ADVIL,MOTRIN) 200 MG tablet Take by mouth as needed.     . lamoTRIgine (LAMICTAL) 25 MG tablet Take 200 mg by mouth daily. 20mg  daily     . Multiple Vitamin  (MULTIVITAMIN) tablet Take 1 tablet by mouth daily.    Marland Kitchen. azelastine (ASTELIN) 0.1 % nasal spray Place 2 sprays into both nostrils 2 (two) times daily. Use in each nostril as directed (Patient not taking: Reported on 10/13/2017) 30 mL 1  . fluticasone (FLONASE) 50 MCG/ACT nasal spray Place 2 sprays into both nostrils daily. (Patient not taking: Reported on 10/13/2017) 16 g 1  . magnesium gluconate (MAGONATE) 500 MG tablet Take 500 mg by mouth daily.    Marland Kitchen. zolpidem (AMBIEN) 5 MG tablet Take 1 tablet (5 mg total) by mouth at bedtime as needed. (Patient not taking: Reported on 10/05/2017) 14 tablet 0   No current facility-administered medications on file prior to visit.     Review of Systems:  As per HPI- otherwise negative.   Physical Examination: Vitals:   10/13/17 1517  BP: (!) 141/61  Pulse: 79  Temp: 98.5 F (36.9 C)  SpO2: 97%   Vitals:   10/13/17 1517  Weight: 260 lb (117.9 kg)  Height: 5\' 8"  (1.727 m)   Body mass index is 39.53 kg/m. Ideal Body Weight: Weight in (lb) to have BMI = 25: 164.1  GEN: WDWN, NAD, Non-toxic, A & O x 3, obese, looks well otherwise  HEENT: Atraumatic, Normocephalic. Neck supple. No masses, No LAD.  Bilateral TM wnl, oropharynx normal.  PEERL,EOMI.   Ears and Nose: No external deformity.   CV: RRR, No M/G/R. No JVD. No thrill. No extra heart sounds. PULM: CTA B, no wheezes, crackles, rhonchi. No retractions. No resp. distress. No accessory muscle use. ABD: S, NT, ND, +BS. No rebound. No HSM. EXTR: No c/c/e NEURO Normal gait.  PSYCH: Normally interactive. Conversant. Not depressed or anxious appearing.  Calm demeanor.   Dg Chest 2 View  Result Date: 10/13/2017 CLINICAL DATA:  Cough.  Low-grade fever. EXAM: CHEST  2 VIEW COMPARISON:  None. FINDINGS: Normal heart size. Normal mediastinal contour. No pneumothorax. No pleural effusion. Lungs appear clear, with no acute consolidative airspace disease and no pulmonary edema. IMPRESSION: No active  cardiopulmonary disease. Electronically Signed   By: Delbert PhenixJason A Poff M.D.   On: 10/13/2017 16:05    Assessment and Plan: Cough - Plan: DG Chest 2 View, benzonatate (TESSALON) 100 MG capsule, HYDROcodone-homatropine (HYCODAN) 5-1.5 MG/5ML syrup, albuterol (PROVENTIL HFA;VENTOLIN  HFA) 108 (90 Base) MCG/ACT inhaler  Low grade fever - Plan: DG Chest 2 View  Here today with a persistent cough.  She has had low grade temps but not a true fever per her report She is finishing up a course of azithromycin She would like something she can take for her cough at night rx for hycodan, and refilled her tessalon perles Albuterol to use for bronchospasm discussed how to use  If not improving over the next few days she will alert me- Sooner if worse.  We might consider a few days of prednisone if cough persists   Signed Abbe Amsterdam, MD  BP Readings from Last 3 Encounters:  10/13/17 (!) 141/61  10/05/17 121/73  10/15/16 122/70

## 2017-10-13 NOTE — Patient Instructions (Addendum)
Your chest x-ray is clear Finish out the azithromycin antibiotic  Use the hycodan syrup as needed for cough, but remember this will make you sleepy Use the albuterol puffer as needed for cough and wheezing Use the tessalon perles as needed for cough during the day If you are not improving please let me know- we can consider a few days of prednisone by mouth

## 2017-10-17 ENCOUNTER — Encounter: Payer: Self-pay | Admitting: Family Medicine

## 2018-01-04 ENCOUNTER — Encounter: Payer: Self-pay | Admitting: Family Medicine

## 2018-01-04 ENCOUNTER — Ambulatory Visit: Payer: BC Managed Care – PPO | Admitting: Family Medicine

## 2018-01-04 ENCOUNTER — Telehealth: Payer: Self-pay

## 2018-01-04 VITALS — BP 112/80 | HR 93 | Temp 98.4°F | Ht 68.0 in | Wt 259.1 lb

## 2018-01-04 DIAGNOSIS — A084 Viral intestinal infection, unspecified: Secondary | ICD-10-CM | POA: Diagnosis not present

## 2018-01-04 MED ORDER — KETOROLAC TROMETHAMINE 60 MG/2ML IM SOLN
60.0000 mg | Freq: Once | INTRAMUSCULAR | Status: AC
  Administered 2018-01-04: 60 mg via INTRAMUSCULAR

## 2018-01-04 MED ORDER — ONDANSETRON HCL 4 MG PO TABS: 4.0000 mg | ORAL_TABLET | Freq: Three times a day (TID) | ORAL | 0 refills | Status: DC | PRN

## 2018-01-04 NOTE — Addendum Note (Signed)
Addended by: Scharlene GlossEWING, Hymen Arnett B on: 01/04/2018 12:02 PM   Modules accepted: Orders

## 2018-01-04 NOTE — Progress Notes (Signed)
Chief Complaint  Patient presents with  . Nasal Congestion  . Nausea  . Diarrhea  . Headache   Subjective Erica Aguirre is a 22 y.o. female who presents with vomiting and diarrhea Symptoms began 3 d. 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 Hypertension    h/o-no medication needed  . Migraines    will occ have aura-   Review of Systems Constitutional:  No fevers or chills Ear/Nose/Mouth/Throat:  No red eyes Gastrointestinal:  As noted in the HPI Musculoskeletal/Extremities: no myalgias Integumentary (Skin/Breast): no rash  Exam BP 112/80 (BP Location: Left Arm, Patient Position: Sitting, Cuff Size: Large)   Pulse 93   Temp 98.4 F (36.9 C) (Oral)   Ht 5\' 8"  (1.727 m)   Wt 259 lb 2 oz (117.5 kg)   SpO2 97%   BMI 39.40 kg/m  General:  well developed, well hydrated, in no apparent distress Skin:  warm, no pallor or diaphoresis, no rashes Throat/Pharynx:  lips and gingiva without lesion; tongue and uvula midline; non-inflamed pharynx; no exudates or postnasal drainage Neck: neck supple without adenopathy, thyromegaly, or masses Lungs:  clear to auscultation, breath sounds equal bilaterally, no respiratory distress, no wheezes Cardio:  regular rate and rhythm without murmurs Abdomen:  abdomen soft, nontender; bowel sounds normal; no masses or organomegaly Extremities:  no clubbing, cyanosis, or edema Psych: Appropriate judgement/insight  Assessment and Plan  Viral gastroenteritis - Plan: ondansetron (ZOFRAN) 4 MG tablet  Orders as above. Push fluids, No NSAIDs for rest of day.  Warning signs and symptoms verbalized and written down in AVS.  F/u if symptoms fail to improve, sooner if worsening. The patient voiced understanding and agreement to the plan.  Jilda Rocheicholas  Paul Chelsea CoveWendling, DO 01/04/18  11:52 AM

## 2018-01-04 NOTE — Progress Notes (Signed)
Pre visit review using our clinic review tool, if applicable. No additional management support is needed unless otherwise documented below in the visit note. 

## 2018-01-04 NOTE — Telephone Encounter (Signed)
PA initiated via Covermymeds; KEY: [id number]. Awaiting determination.

## 2018-01-04 NOTE — Patient Instructions (Signed)
Continue to push fluids, practice good hand hygiene, and cover your mouth if you cough.  If you start having fevers, shaking or worsening symptoms, seek immediate care.  Drink something with electrolytes for as long as you are having diarrhea- like Gatorade, Powerade, Pedialyte.  Let us know if you need anything.

## 2018-01-05 MED ORDER — PROMETHAZINE HCL 6.25 MG/5ML PO SYRP: 6.2500 mg | ORAL_SOLUTION | Freq: Four times a day (QID) | ORAL | 0 refills | Status: DC | PRN

## 2018-01-05 NOTE — Telephone Encounter (Signed)
PA denied. Plan covers medication if patient has hyperemesis gravidarum, is undergoing radiation therapy, or is undergoing moderate to highly emetogenic chemotherapy.

## 2018-01-05 NOTE — Telephone Encounter (Signed)
I will call in phenergan instead. Call pt and see if she is still having issues with nausea. If she is better, please cancel rx. TY.

## 2018-01-05 NOTE — Telephone Encounter (Signed)
Called left message to call back 

## 2018-01-06 NOTE — Telephone Encounter (Signed)
Pt called back.  Pt states she is a little better, as long as she does not eat.  Only able to eat saltine crackers, a small amount of chicken and rice soup.  But other wise still having nausea. Pt also reports on and off diarrhea.

## 2018-01-06 NOTE — Telephone Encounter (Signed)
Called left detailed message of providers information/and to call us back.

## 2018-01-07 NOTE — Telephone Encounter (Signed)
I was too busy on Friday to stop and investigate this cal. Now seeing this on Saturday. Will you call pt on Monday and see how she is? Dr. Carmelia RollerWendling saw her. So I would need fresh update Monday if I were to give further advise. She may need to be seen again?

## 2018-01-09 NOTE — Telephone Encounter (Signed)
Called and informed the patient of all information. She is still having nausea/offered appt today, but she is at work. Scheduled tomorrow with Dahlia ClientEdward S. At 9:45 AM

## 2018-01-09 NOTE — Telephone Encounter (Signed)
I had called in Phenergan should she still be having issues with nausea. If she is still having issues, may need to return to clinic. TY.

## 2018-01-10 ENCOUNTER — Ambulatory Visit: Payer: BC Managed Care – PPO | Admitting: Medical

## 2018-01-10 ENCOUNTER — Telehealth: Payer: Self-pay | Admitting: Medical

## 2018-01-10 ENCOUNTER — Encounter: Payer: Self-pay | Admitting: Medical

## 2018-01-10 VITALS — BP 128/63 | HR 84 | Temp 98.3°F | Resp 16 | Ht 68.0 in | Wt 257.6 lb

## 2018-01-10 DIAGNOSIS — R110 Nausea: Secondary | ICD-10-CM | POA: Diagnosis not present

## 2018-01-10 DIAGNOSIS — K529 Noninfective gastroenteritis and colitis, unspecified: Secondary | ICD-10-CM | POA: Diagnosis not present

## 2018-01-10 MED ORDER — ONDANSETRON 8 MG PO TBDP: 8.0000 mg | ORAL_TABLET | Freq: Three times a day (TID) | ORAL | 0 refills | Status: DC | PRN

## 2018-01-10 NOTE — Progress Notes (Signed)
Subjective:    Patient ID: Erica Aguirre, female    DOB: 06-Mar-1996, 22 y.o.   MRN: [id number]  HPI  Pt in for follow up.  Pt stats she has not been eating. She states has not eaten very much since Saturday. She states diarrhea tapered off around that time.   Pt states no longer has any abdomen pain or cramping.  Pt states her nausea  Is moderate. Has no appetite. Pt was given zofran 4 mg dose and states helps but still feels nausea.   Pt 7 days into symptoms. Most now most of symptoms have resolved.  LMP- Started on Friday. Normal cycle and came when it was expected.    Review of Systems  Constitutional: Negative for chills, fatigue and fever.  HENT: Negative for congestion, ear discharge, nosebleeds, postnasal drip and sore throat.   Respiratory: Negative for cough, chest tightness and wheezing.   Cardiovascular: Negative for chest pain and palpitations.  Gastrointestinal: Positive for nausea. Negative for abdominal distention, abdominal pain, constipation, diarrhea and vomiting.  Genitourinary: Negative for difficulty urinating, enuresis, flank pain and frequency.  Musculoskeletal: Negative for gait problem.  Skin: Negative for rash.  Neurological: Negative for dizziness, numbness and headaches.  Hematological: Negative for adenopathy. Does not bruise/bleed easily.  Psychiatric/Behavioral: Negative for behavioral problems, confusion, hallucinations and self-injury.   Past Medical History:  Diagnosis Date  . Hypertension    h/o-no medication needed  . Migraines    will occ have aura-     Social History   Socioeconomic History  . Marital status: Single    Spouse name: Not on file  . Number of children: Not on file  . Years of education: Not on file  . Highest education level: Not on file  Occupational History  . Not on file  Social Needs  . Financial resource strain: Not on file  . Food insecurity:    Worry: Not on file    Inability: Not on file  .  Transportation needs:    Medical: Not on file    Non-medical: Not on file  Tobacco Use  . Smoking status: Never Smoker  . Smokeless tobacco: Never Used  Substance and Sexual Activity  . Alcohol use: No    Alcohol/week: 0.0 oz  . Drug use: No  . Sexual activity: Never    Partners: Male    Birth control/protection: IUD  Lifestyle  . Physical activity:    Days per week: Not on file    Minutes per session: Not on file  . Stress: Not on file  Relationships  . Social connections:    Talks on phone: Not on file    Gets together: Not on file    Attends religious service: Not on file    Active member of club or organization: Not on file    Attends meetings of clubs or organizations: Not on file    Relationship status: Not on file  . Intimate partner violence:    Fear of current or ex partner: Not on file    Emotionally abused: Not on file    Physically abused: Not on file    Forced sexual activity: Not on file  Other Topics Concern  . Not on file  Social History Narrative  . Not on file    Past Surgical History:  Procedure Laterality Date  . BACK SURGERY  12/01/2015    L3-L5 fusion done at [facility]   . WISDOM TOOTH EXTRACTION  Family History  Problem Relation Age of Onset  . Breast cancer Other        maternal cousins  . Pancreatic cancer Other        maternal side  . Ovarian cancer Other        maternal side  . Hypertension Mother        and maternal side  . Diabetes Mother        ? if diag or pre-diabetes  . Gestational diabetes Mother   . Migraines Mother        as teenager  . Hypertension Father        and paternal side  . Migraines Father   . Diabetes Maternal Grandmother   . Stroke Maternal Grandfather   . Stroke Paternal Grandmother   . Asthma Brother        sports induced when younger    Allergies  Allergen Reactions  . Cefzil [Cefprozil]     rash  . Flagyl [Metronidazole]     rash    Current Outpatient Medications on File Prior to  Visit  Medication Sig Dispense Refill  . acetaminophen (TYLENOL) 325 MG tablet Take by mouth as needed.     . baclofen (LIORESAL) 20 MG tablet     . fluticasone (FLONASE) 50 MCG/ACT nasal spray Place 2 sprays into both nostrils daily. 16 g 1  . lamoTRIgine (LAMICTAL) 25 MG tablet Take 200 mg by mouth daily. 20mg  daily     . magnesium gluconate (MAGONATE) 500 MG tablet Take 500 mg by mouth daily.    . Multiple Vitamin (MULTIVITAMIN) tablet Take 1 tablet by mouth daily.    . promethazine (PHENERGAN) 6.25 MG/5ML syrup Take 5 mLs (6.25 mg total) by mouth every 6 (six) hours as needed for nausea or vomiting. 120 mL 0   No current facility-administered medications on file prior to visit.     BP 128/63   Pulse 84   Temp 98.3 F (36.8 C) (Oral)   Resp 16   Ht 5\' 8"  (1.727 m)   Wt 257 lb 9.6 oz (116.8 kg)   LMP 01/06/2018   SpO2 100%   BMI 39.17 kg/m       Objective:   Physical Exam   General Appearance- Not in acute distress.  HEENT Eyes- Scleraeral/Conjuntiva-bilat- Not Yellow. Mouth & Throat- Normal.  Chest and Lung Exam Auscultation: Breath sounds:-Normal. Adventitious sounds:- No Adventitious sounds.  Cardiovascular Auscultation:Rythm - Regular. Heart Sounds -Normal heart sounds.  Abdomen Inspection:-Inspection Normal.  Palpation/Perucssion: Palpation and Percussion of the abdomen reveal- Non Tender, No Rebound tenderness, No rigidity(Guarding) and No Palpable abdominal masses.  Liver:-Normal.  Spleen:- Normal.   Back- no cva tenderness.  Neurologic-cranial nerves III through XII grossly intact.     Assessment & Plan:  Your gastroenteritis type symptoms appear to be largely resolved.  Have a year nausea is persisting.  I think he would benefit from a higher dose of Zofran and in 3 days with hope/expect that your appetite will start to come back.  Try to advance your diet in 3 days but still avoiding greasy or fried foods.  Also try to avoid meats.  Make sure that  you are keeping well-hydrated as you report over the past 7 days not eating much.  If you would update me via my chart on approximate day for the past 2 how you are tolerating advancing your diet.  Follow-up in 7 to 10 days if needed for any persisting signs or symptoms.  Note if your nausea just lingering despite resolution of all other gastroenteritis type signs and symptoms then would at that point consider initiating lab work-up  Note later during the day when reviewing the note I saw in the chief complaint that staff or CMA put and bad headache.  Patient did not report any headache to me directly during the interview.  So I am going to send a message to medical assistant to call patient tomorrow and inquire whether. she did have headache or currently has headache   Esperanza RichtersEdward Cami Delawder, PA-C.

## 2018-01-10 NOTE — Telephone Encounter (Signed)
After hours when reviewing the note to close, I saw that on the chief complaint he said that headache.  Patient did not mention any headache to me but after seeing just want to know whether or not she has a headache presently or not.  Would you call patient tomorrow and ask her if she had headache yesterday and does she currently have headache?.  Please let me know tomorrow.

## 2018-01-10 NOTE — Patient Instructions (Addendum)
   Your gastroenteritis type symptoms appear to be largely resolved.  Have a year nausea is persisting.  I think he would benefit from a higher dose of Zofran and in 3 days with hope/expect that your appetite will start to come back.  Try to advance your diet in 3 days but still avoiding greasy or fried foods.  Also try to avoid meats.  Make sure that you are keeping well-hydrated as you report over the past 7 days not eating much.  If you would update me via my chart on approximate day for the past 2 how you are tolerating advancing your diet.  Follow-up in 7 to 10 days if needed for any persisting signs or symptoms.  Note if your nausea just lingering despite resolution of all other gastroenteritis type signs and symptoms then would at that point consider initiating lab work-up.

## 2018-01-12 NOTE — Telephone Encounter (Signed)
Pt states she is no longer having headache

## 2018-05-22 ENCOUNTER — Ambulatory Visit: Payer: BC Managed Care – PPO | Admitting: Nurse Practitioner

## 2018-05-22 ENCOUNTER — Encounter: Payer: Self-pay | Admitting: Nurse Practitioner

## 2018-05-22 VITALS — BP 118/78 | HR 92 | Temp 98.8°F | Ht 68.0 in | Wt 255.0 lb

## 2018-05-22 DIAGNOSIS — J0140 Acute pansinusitis, unspecified: Secondary | ICD-10-CM

## 2018-05-22 DIAGNOSIS — J209 Acute bronchitis, unspecified: Secondary | ICD-10-CM

## 2018-05-22 MED ORDER — DM-GUAIFENESIN ER 30-600 MG PO TB12: 1.0000 | ORAL_TABLET | Freq: Two times a day (BID) | ORAL | 0 refills | Status: DC | PRN

## 2018-05-22 MED ORDER — ALBUTEROL SULFATE HFA 108 (90 BASE) MCG/ACT IN AERS
2.0000 | INHALATION_SPRAY | Freq: Four times a day (QID) | RESPIRATORY_TRACT | 2 refills | Status: DC | PRN
Start: 2018-05-22 — End: 2018-12-25

## 2018-05-22 MED ORDER — BENZONATATE 200 MG PO CAPS: 200.0000 mg | ORAL_CAPSULE | Freq: Two times a day (BID) | ORAL | 0 refills | Status: DC | PRN

## 2018-05-22 MED ORDER — SALINE SPRAY 0.65 % NA SOLN: 1.0000 | NASAL | 0 refills | Status: DC | PRN

## 2018-05-22 MED ORDER — HYDROCODONE-HOMATROPINE 5-1.5 MG/5ML PO SYRP: 5.0000 mL | ORAL_SOLUTION | Freq: Every evening | ORAL | 0 refills | Status: DC | PRN

## 2018-05-22 MED ORDER — AZITHROMYCIN 250 MG PO TABS: 250.0000 mg | ORAL_TABLET | Freq: Every day | ORAL | 0 refills | Status: DC

## 2018-05-22 MED ORDER — METHYLPREDNISOLONE ACETATE 40 MG/ML IJ SUSP
40.0000 mg | Freq: Once | INTRAMUSCULAR | Status: AC
  Administered 2018-05-22: 40 mg via INTRAMUSCULAR

## 2018-05-22 NOTE — Progress Notes (Signed)
Subjective:  Patient ID: Erica Aguirre, female    DOB: 07/31/1996  Age: 22 y.o. MRN: [id number]  CC: Cough (patient complaing of sore throat,congestion,dry coughing, hot and cold. this has been going on for 4 days. tried OTC cold and flu,tamaflu,old rx for cough syrup and bezonate--didnt help. )  Cough  This is a new problem. The current episode started in the past 7 days. The problem has been gradually worsening. The problem occurs constantly. The cough is productive of purulent sputum. Associated symptoms include chills, headaches, myalgias, nasal congestion, postnasal drip, rhinorrhea and a sore throat. Pertinent negatives include no fever, shortness of breath or wheezing. The symptoms are aggravated by lying down. She has tried OTC cough suppressant and prescription cough suppressant for the symptoms. The treatment provided mild relief. Her past medical history is significant for bronchitis and environmental allergies.   Reviewed past Medical, Social and Family history today.  Outpatient Medications Prior to Visit  Medication Sig Dispense Refill  . acetaminophen (TYLENOL) 325 MG tablet Take by mouth as needed.     . baclofen (LIORESAL) 20 MG tablet     . lamoTRIgine (LAMICTAL) 25 MG tablet Take 200 mg by mouth daily. 20mg  daily     . magnesium gluconate (MAGONATE) 500 MG tablet Take 500 mg by mouth daily.    . Multiple Vitamin (MULTIVITAMIN) tablet Take 1 tablet by mouth daily.    . fluticasone (FLONASE) 50 MCG/ACT nasal spray Place 2 sprays into both nostrils daily. (Patient not taking: Reported on 05/22/2018) 16 g 1  . ondansetron (ZOFRAN ODT) 8 MG disintegrating tablet Take 1 tablet (8 mg total) by mouth every 8 (eight) hours as needed for nausea or vomiting. (Patient not taking: Reported on 05/22/2018) 20 tablet 0  . promethazine (PHENERGAN) 6.25 MG/5ML syrup Take 5 mLs (6.25 mg total) by mouth every 6 (six) hours as needed for nausea or vomiting. (Patient not taking: Reported on 05/22/2018)  120 mL 0   No facility-administered medications prior to visit.     ROS See HPI  Objective:  BP 118/78   Pulse 92   Temp 98.8 F (37.1 C) (Oral)   Ht 5\' 8"  (1.727 m)   Wt 255 lb (115.7 kg)   SpO2 97%   BMI 38.77 kg/m   BP Readings from Last 3 Encounters:  05/22/18 118/78  01/10/18 128/63  01/04/18 112/80    Wt Readings from Last 3 Encounters:  05/22/18 255 lb (115.7 kg)  01/10/18 257 lb 9.6 oz (116.8 kg)  01/04/18 259 lb 2 oz (117.5 kg)    Physical Exam  Constitutional: She is oriented to person, place, and time.  HENT:  Right Ear: Tympanic membrane, external ear and ear canal normal.  Left Ear: Tympanic membrane, external ear and ear canal normal.  Nose: Mucosal edema and rhinorrhea present. Right sinus exhibits maxillary sinus tenderness. Right sinus exhibits no frontal sinus tenderness. Left sinus exhibits maxillary sinus tenderness.  Mouth/Throat: Uvula is midline. Posterior oropharyngeal erythema present. No oropharyngeal exudate.  Cardiovascular: Normal rate and regular rhythm.  Pulmonary/Chest: Effort normal and breath sounds normal. No stridor. No respiratory distress. She has no wheezes. She has no rales.  Neurological: She is alert and oriented to person, place, and time.  Skin: No rash noted.  Psychiatric: She has a normal mood and affect. Her behavior is normal.  Vitals reviewed.   Lab Results  Component Value Date   WBC 9.2 05/06/2016   HGB 14.4 05/06/2016   HCT 42.2 05/06/2016  PLT 286.0 05/06/2016   GLUCOSE 91 05/06/2016   ALT 20 05/06/2016   AST 17 05/06/2016   NA 139 05/06/2016   K 4.5 05/06/2016   CL 105 05/06/2016   CREATININE 0.72 05/06/2016   BUN 12 05/06/2016   CO2 24 05/06/2016   TSH 0.71 05/06/2016    Dg Chest 2 View  Result Date: 10/13/2017 CLINICAL DATA:  Cough.  Low-grade fever. EXAM: CHEST  2 VIEW COMPARISON:  None. FINDINGS: Normal heart size. Normal mediastinal contour. No pneumothorax. No pleural effusion. Lungs appear  clear, with no acute consolidative airspace disease and no pulmonary edema. IMPRESSION: No active cardiopulmonary disease. Electronically Signed   By: Delbert Phenix M.D.   On: 10/13/2017 16:05    Assessment & Plan:   Labrenda was seen today for cough.  Diagnoses and all orders for this visit:  Acute bronchitis, unspecified organism -     benzonatate (TESSALON) 200 MG capsule; Take 1 capsule (200 mg total) by mouth 2 (two) times daily as needed for cough. -     HYDROcodone-homatropine (HYCODAN) 5-1.5 MG/5ML syrup; Take 5 mLs by mouth at bedtime as needed for cough. -     albuterol (PROVENTIL HFA;VENTOLIN HFA) 108 (90 Base) MCG/ACT inhaler; Inhale 2 puffs into the lungs every 6 (six) hours as needed for wheezing or shortness of breath. -     methylPREDNISolone acetate (DEPO-MEDROL) injection 40 mg -     azithromycin (ZITHROMAX Z-PAK) 250 MG tablet; Take 1 tablet (250 mg total) by mouth daily for 8 days. Take 2tabs on first day, then 1tab once a day till complete -     sodium chloride (OCEAN) 0.65 % SOLN nasal spray; Place 1 spray into both nostrils as needed for congestion. -     dextromethorphan-guaiFENesin (MUCINEX DM) 30-600 MG 12hr tablet; Take 1 tablet by mouth 2 (two) times daily as needed for cough.  Acute non-recurrent pansinusitis -     benzonatate (TESSALON) 200 MG capsule; Take 1 capsule (200 mg total) by mouth 2 (two) times daily as needed for cough. -     HYDROcodone-homatropine (HYCODAN) 5-1.5 MG/5ML syrup; Take 5 mLs by mouth at bedtime as needed for cough. -     albuterol (PROVENTIL HFA;VENTOLIN HFA) 108 (90 Base) MCG/ACT inhaler; Inhale 2 puffs into the lungs every 6 (six) hours as needed for wheezing or shortness of breath. -     methylPREDNISolone acetate (DEPO-MEDROL) injection 40 mg -     azithromycin (ZITHROMAX Z-PAK) 250 MG tablet; Take 1 tablet (250 mg total) by mouth daily for 8 days. Take 2tabs on first day, then 1tab once a day till complete -     sodium chloride  (OCEAN) 0.65 % SOLN nasal spray; Place 1 spray into both nostrils as needed for congestion. -     dextromethorphan-guaiFENesin (MUCINEX DM) 30-600 MG 12hr tablet; Take 1 tablet by mouth 2 (two) times daily as needed for cough.   I have discontinued Christie Sorbello's fluticasone, promethazine, and ondansetron. I am also having her start on benzonatate, HYDROcodone-homatropine, albuterol, azithromycin, sodium chloride, and dextromethorphan-guaiFENesin. Additionally, I am having her maintain her baclofen, lamoTRIgine, acetaminophen, magnesium gluconate, and multivitamin. We will continue to administer methylPREDNISolone acetate.  Meds ordered this encounter  Medications  . benzonatate (TESSALON) 200 MG capsule    Sig: Take 1 capsule (200 mg total) by mouth 2 (two) times daily as needed for cough.    Dispense:  20 capsule    Refill:  0    Order  Specific Question:   Supervising Provider    Answer:   Dianne Dun [[id number]]  . HYDROcodone-homatropine (HYCODAN) 5-1.5 MG/5ML syrup    Sig: Take 5 mLs by mouth at bedtime as needed for cough.    Dispense:  60 mL    Refill:  0    Order Specific Question:   Supervising Provider    Answer:   Dianne Dun [[id number]]  . albuterol (PROVENTIL HFA;VENTOLIN HFA) 108 (90 Base) MCG/ACT inhaler    Sig: Inhale 2 puffs into the lungs every 6 (six) hours as needed for wheezing or shortness of breath.    Dispense:  1 Inhaler    Refill:  2    Order Specific Question:   Supervising Provider    Answer:   Dianne Dun [[id number]]  . methylPREDNISolone acetate (DEPO-MEDROL) injection 40 mg  . azithromycin (ZITHROMAX Z-PAK) 250 MG tablet    Sig: Take 1 tablet (250 mg total) by mouth daily for 8 days. Take 2tabs on first day, then 1tab once a day till complete    Dispense:  6 tablet    Refill:  0    Do not fill before 05/25/2018 or after 06/03/2018    Order Specific Question:   Supervising Provider    Answer:   Dianne Dun [[id number]]  . sodium chloride (OCEAN) 0.65 % SOLN nasal  spray    Sig: Place 1 spray into both nostrils as needed for congestion.    Dispense:  15 mL    Refill:  0    Order Specific Question:   Supervising Provider    Answer:   Dianne Dun [[id number]]  . dextromethorphan-guaiFENesin (MUCINEX DM) 30-600 MG 12hr tablet    Sig: Take 1 tablet by mouth 2 (two) times daily as needed for cough.    Dispense:  14 tablet    Refill:  0    Order Specific Question:   Supervising Provider    Answer:   Dianne Dun [[id number]]    Follow-up: Return if symptoms worsen or fail to improve.  Alysia Penna, NP

## 2018-05-22 NOTE — Patient Instructions (Addendum)
Start azithromycin if no improvement by Thursday 05/25/2018.  Push adequate oral hydration.   Acute Bronchitis, Adult Acute bronchitis is sudden (acute) swelling of the air tubes (bronchi) in the lungs. Acute bronchitis causes these tubes to fill with mucus, which can make it hard to breathe. It can also cause coughing or wheezing. In adults, acute bronchitis usually goes away within 2 weeks. A cough caused by bronchitis may last up to 3 weeks. Smoking, allergies, and asthma can make the condition worse. Repeated episodes of bronchitis may cause further lung problems, such as chronic obstructive pulmonary disease (COPD). What are the causes? This condition can be caused by germs and by substances that irritate the lungs, including:  Cold and flu viruses. This condition is most often caused by the same virus that causes a cold.  Bacteria.  Exposure to tobacco smoke, dust, fumes, and air pollution.  What increases the risk? This condition is more likely to develop in people who:  Have close contact with someone with acute bronchitis.  Are exposed to lung irritants, such as tobacco smoke, dust, fumes, and vapors.  Have a weak immune system.  Have a respiratory condition such as asthma.  What are the signs or symptoms? Symptoms of this condition include:  A cough.  Coughing up clear, yellow, or green mucus.  Wheezing.  Chest congestion.  Shortness of breath.  A fever.  Body aches.  Chills.  A sore throat.  How is this diagnosed? This condition is usually diagnosed with a physical exam. During the exam, your health care provider may order tests, such as chest X-rays, to rule out other conditions. He or she may also:  Test a sample of your mucus for bacterial infection.  Check the level of oxygen in your blood. This is done to check for pneumonia.  Do a chest X-ray or lung function testing to rule out pneumonia and other conditions.  Perform blood tests.  Your  health care provider will also ask about your symptoms and medical history. How is this treated? Most cases of acute bronchitis clear up over time without treatment. Your health care provider may recommend:  Drinking more fluids. Drinking more makes your mucus thinner, which may make it easier to breathe.  Taking a medicine for a fever or cough.  Taking an antibiotic medicine.  Using an inhaler to help improve shortness of breath and to control a cough.  Using a cool mist vaporizer or humidifier to make it easier to breathe.  Follow these instructions at home: Medicines  Take over-the-counter and prescription medicines only as told by your health care provider.  If you were prescribed an antibiotic, take it as told by your health care provider. Do not stop taking the antibiotic even if you start to feel better. General instructions  Get plenty of rest.  Drink enough fluids to keep your urine clear or pale yellow.  Avoid smoking and secondhand smoke. Exposure to cigarette smoke or irritating chemicals will make bronchitis worse. If you smoke and you need help quitting, ask your health care provider. Quitting smoking will help your lungs heal faster.  Use an inhaler, cool mist vaporizer, or humidifier as told by your health care provider.  Keep all follow-up visits as told by your health care provider. This is important. How is this prevented? To lower your risk of getting this condition again:  Wash your hands often with soap and water. If soap and water are not available, use hand sanitizer.  Avoid contact  with people who have cold symptoms.  Try not to touch your hands to your mouth, nose, or eyes.  Make sure to get the flu shot every year.  Contact a health care provider if:  Your symptoms do not improve in 2 weeks of treatment. Get help right away if:  You cough up blood.  You have chest pain.  You have severe shortness of breath.  You become dehydrated.  You  faint or keep feeling like you are going to faint.  You keep vomiting.  You have a severe headache.  Your fever or chills gets worse. This information is not intended to replace advice given to you by your health care provider. Make sure you discuss any questions you have with your health care provider. Document Released: 11/11/2004 Document Revised: 04/28/2016 Document Reviewed: 03/24/2016 Elsevier Interactive Patient Education  [employer]2018 [employer]

## 2018-05-23 ENCOUNTER — Encounter: Payer: Self-pay | Admitting: Nurse Practitioner

## 2018-05-23 ENCOUNTER — Ambulatory Visit (INDEPENDENT_AMBULATORY_CARE_PROVIDER_SITE_OTHER): Payer: BC Managed Care – PPO | Admitting: Nurse Practitioner

## 2018-05-23 VITALS — BP 110/70 | HR 86 | Temp 97.7°F | Ht 68.0 in

## 2018-05-23 DIAGNOSIS — J0140 Acute pansinusitis, unspecified: Secondary | ICD-10-CM | POA: Diagnosis not present

## 2018-05-23 DIAGNOSIS — H6503 Acute serous otitis media, bilateral: Secondary | ICD-10-CM | POA: Diagnosis not present

## 2018-05-23 DIAGNOSIS — J209 Acute bronchitis, unspecified: Secondary | ICD-10-CM | POA: Diagnosis not present

## 2018-05-23 MED ORDER — AZITHROMYCIN 250 MG PO TABS: 250.0000 mg | ORAL_TABLET | Freq: Every day | ORAL | 0 refills | Status: DC

## 2018-05-23 NOTE — Patient Instructions (Addendum)
Please return to office tomorrow for evaluation of right ear with otoscope.  Start azithromycin. Use ibuprofen or tylenol for pain.  Do not insert anything into right ear. You ear is not perforated.  Maintain adequate oral hydration.  Return to office if no improvement in 1week Otitis Media, Adult Otitis media occurs when there is inflammation and fluid in the middle ear. Your middle ear is a part of the ear that contains bones for hearing as well as air that helps send sounds to your brain. What are the causes? This condition is caused by a blockage in the eustachian tube. This tube drains fluid from the ear to the back of the nose (nasopharynx). A blockage in this tube can be caused by an object or by swelling (edema) in the tube. Problems that can cause a blockage include:  A cold or other upper respiratory infection.  Allergies.  An irritant, such as tobacco smoke.  Enlarged adenoids. The adenoids are areas of soft tissue located high in the back of the throat, behind the nose and the roof of the mouth.  A mass in the nasopharynx.  Damage to the ear caused by pressure changes (barotrauma).  What are the signs or symptoms? Symptoms of this condition include:  Ear pain.  A fever.  Decreased hearing.  A headache.  Tiredness (lethargy).  Fluid leaking from the ear.  Ringing in the ear.  How is this diagnosed? This condition is diagnosed with a physical exam. During the exam your health care provider will use an instrument called an otoscope to look into your ear and check for redness, swelling, and fluid. He or she will also ask about your symptoms. Your health care provider may also order tests, such as:  A test to check the movement of the eardrum (pneumatic otoscopy). This test is done by squeezing a small amount of air into the ear.  A test that changes air pressure in the middle ear to check how well the eardrum moves and whether the eustachian tube is working  (tympanogram).  How is this treated? This condition usually goes away on its own within 3-5 days. But if the condition is caused by a bacteria infection and does not go away own its own, or keeps coming back, your health care provider may:  Prescribe antibiotic medicines to treat the infection.  Prescribe or recommend medicines to control pain.  Follow these instructions at home:  Take over-the-counter and prescription medicines only as told by your health care provider.  If you were prescribed an antibiotic medicine, take it as told by your health care provider. Do not stop taking the antibiotic even if you start to feel better.  Keep all follow-up visits as told by your health care provider. This is important. Contact a health care provider if:  You have bleeding from your nose.  There is a lump on your neck.  You are not getting better in 5 days.  You feel worse instead of better. Get help right away if:  You have severe pain that is not controlled with medicine.  You have swelling, redness, or pain around your ear.  You have stiffness in your neck.  A part of your face is paralyzed.  The bone behind your ear (mastoid) is tender when you touch it.  You develop a severe headache. Summary  Otitis media is redness, soreness, and swelling of the middle ear.  This condition usually goes away on its own within 3-5 days.  If  the problem does not go away in 3-5 days, your health care provider may prescribe or recommend medicines to treat your symptoms.  If you were prescribed an antibiotic medicine, take it as told by your health care provider. This information is not intended to replace advice given to you by your health care provider. Make sure you discuss any questions you have with your health care provider. Document Released: 07/09/2004 Document Revised: 09/24/2016 Document Reviewed: 09/24/2016 Elsevier Interactive Patient Education  [employer].

## 2018-05-23 NOTE — Progress Notes (Signed)
Subjective:  Patient ID: Erica Aguirre, female    DOB: 1996/08/18  Age: 22 y.o. MRN: [id number]  CC: Ear Pain (left ear painful when move. feel better since yesterday. )  HPI   Miracle  presents with sudden onset of right ear pain last night, reports hearing a pop followed by decreased hearing and tinnitus. No fever, no neck pain, no dizziness. Denies any head injury Reports improved sinus congestion and cough with use of cough medication and depomedrol IM.  Reviewed past Medical, Social and Family history today.  Outpatient Medications Prior to Visit  Medication Sig Dispense Refill  . acetaminophen (TYLENOL) 325 MG tablet Take by mouth as needed.     Marland Kitchen albuterol (PROVENTIL HFA;VENTOLIN HFA) 108 (90 Base) MCG/ACT inhaler Inhale 2 puffs into the lungs every 6 (six) hours as needed for wheezing or shortness of breath. 1 Inhaler 2  . baclofen (LIORESAL) 20 MG tablet     . benzonatate (TESSALON) 200 MG capsule Take 1 capsule (200 mg total) by mouth 2 (two) times daily as needed for cough. 20 capsule 0  . HYDROcodone-homatropine (HYCODAN) 5-1.5 MG/5ML syrup Take 5 mLs by mouth at bedtime as needed for cough. 60 mL 0  . lamoTRIgine (LAMICTAL) 25 MG tablet Take 200 mg by mouth daily. 20mg  daily     . magnesium gluconate (MAGONATE) 500 MG tablet Take 500 mg by mouth daily.    . Multiple Vitamin (MULTIVITAMIN) tablet Take 1 tablet by mouth daily.    . sodium chloride (OCEAN) 0.65 % SOLN nasal spray Place 1 spray into both nostrils as needed for congestion. 15 mL 0  . [START ON 05/25/2018] azithromycin (ZITHROMAX Z-PAK) 250 MG tablet Take 1 tablet (250 mg total) by mouth daily for 8 days. Take 2tabs on first day, then 1tab once a day till complete (Patient not taking: Reported on 05/23/2018) 6 tablet 0  . dextromethorphan-guaiFENesin (MUCINEX DM) 30-600 MG 12hr tablet Take 1 tablet by mouth 2 (two) times daily as needed for cough. (Patient not taking: Reported on 05/23/2018) 14 tablet 0   No  facility-administered medications prior to visit.     ROS See HPI  Objective:  BP 110/70   Pulse 86   Temp 97.7 F (36.5 C) (Oral)   Ht 5\' 8"  (1.727 m)   SpO2 97%   BMI 38.77 kg/m   BP Readings from Last 3 Encounters:  05/23/18 110/70  05/22/18 118/78  01/10/18 128/63    Wt Readings from Last 3 Encounters:  05/22/18 255 lb (115.7 kg)  01/10/18 257 lb 9.6 oz (116.8 kg)  01/04/18 259 lb 2 oz (117.5 kg)    Physical Exam  HENT:  Right Ear: External ear normal. No drainage. No mastoid tenderness. Tympanic membrane is injected, erythematous and retracted. Tympanic membrane is not perforated. A middle ear effusion is present.  Left Ear: External ear normal. No drainage. No mastoid tenderness. Tympanic membrane is injected, erythematous and retracted. Tympanic membrane is not perforated. A middle ear effusion is present.  Nose: Nose normal.  Mouth/Throat: Oropharynx is clear and moist. No oropharyngeal exudate.  Neck: Normal range of motion. Neck supple.  Lymphadenopathy:    She has cervical adenopathy.    Lab Results  Component Value Date   WBC 9.2 05/06/2016   HGB 14.4 05/06/2016   HCT 42.2 05/06/2016   PLT 286.0 05/06/2016   GLUCOSE 91 05/06/2016   ALT 20 05/06/2016   AST 17 05/06/2016   NA 139 05/06/2016   K 4.5  05/06/2016   CL 105 05/06/2016   CREATININE 0.72 05/06/2016   BUN 12 05/06/2016   CO2 24 05/06/2016   TSH 0.71 05/06/2016    Dg Chest 2 View  Result Date: 10/13/2017 CLINICAL DATA:  Cough.  Low-grade fever. EXAM: CHEST  2 VIEW COMPARISON:  None. FINDINGS: Normal heart size. Normal mediastinal contour. No pneumothorax. No pleural effusion. Lungs appear clear, with no acute consolidative airspace disease and no pulmonary edema. IMPRESSION: No active cardiopulmonary disease. Electronically Signed   By: Delbert PhenixJason A Poff M.D.   On: 10/13/2017 16:05    Assessment & Plan:   Ashok CordiaMarissa was seen today for ear pain.  Diagnoses and all orders for this  visit:  Non-recurrent acute serous otitis media of both ears -     pseudoephedrine-guaifenesin (MUCINEX D) 60-600 MG 12 hr tablet; Take 1 tablet by mouth every 12 (twelve) hours.  Acute bronchitis, unspecified organism -     azithromycin (ZITHROMAX Z-PAK) 250 MG tablet; Take 1 tablet (250 mg total) by mouth daily. Take 2tabs on first day, then 1tab once a day till complete  Acute non-recurrent pansinusitis -     azithromycin (ZITHROMAX Z-PAK) 250 MG tablet; Take 1 tablet (250 mg total) by mouth daily. Take 2tabs on first day, then 1tab once a day till complete -     pseudoephedrine-guaifenesin (MUCINEX D) 60-600 MG 12 hr tablet; Take 1 tablet by mouth every 12 (twelve) hours.   I have discontinued Tawonna Murdy's dextromethorphan-guaiFENesin. I have also changed her azithromycin. Additionally, I am having her start on pseudoephedrine-guaifenesin. Lastly, I am having her maintain her baclofen, lamoTRIgine, acetaminophen, magnesium gluconate, multivitamin, benzonatate, HYDROcodone-homatropine, albuterol, and sodium chloride.  Meds ordered this encounter  Medications  . azithromycin (ZITHROMAX Z-PAK) 250 MG tablet    Sig: Take 1 tablet (250 mg total) by mouth daily. Take 2tabs on first day, then 1tab once a day till complete    Dispense:  6 tablet    Refill:  0    Order Specific Question:   Supervising Provider    Answer:   Dianne DunARON, TALIA M [[id number]]  . pseudoephedrine-guaifenesin (MUCINEX D) 60-600 MG 12 hr tablet    Sig: Take 1 tablet by mouth every 12 (twelve) hours.    Dispense:  6 tablet    Refill:  0    Order Specific Question:   Supervising Provider    Answer:   Dianne DunARON, TALIA M [[id number]]    Follow-up: Return if symptoms worsen or fail to improve.  Alysia Pennaharlotte Jaylia Pettus, NP

## 2018-05-24 ENCOUNTER — Encounter: Payer: Self-pay | Admitting: Nurse Practitioner

## 2018-05-24 MED ORDER — PSEUDOEPHEDRINE-GUAIFENESIN ER 60-600 MG PO TB12: 1.0000 | ORAL_TABLET | Freq: Two times a day (BID) | ORAL | 0 refills | Status: DC

## 2018-05-29 ENCOUNTER — Ambulatory Visit: Payer: BC Managed Care – PPO | Admitting: Nurse Practitioner

## 2018-05-29 ENCOUNTER — Encounter: Payer: Self-pay | Admitting: Nurse Practitioner

## 2018-05-29 VITALS — BP 126/80 | HR 106 | Temp 98.5°F | Ht 68.0 in | Wt 254.2 lb

## 2018-05-29 DIAGNOSIS — H9193 Unspecified hearing loss, bilateral: Secondary | ICD-10-CM | POA: Diagnosis not present

## 2018-05-29 DIAGNOSIS — H6503 Acute serous otitis media, bilateral: Secondary | ICD-10-CM | POA: Diagnosis not present

## 2018-05-29 DIAGNOSIS — J0140 Acute pansinusitis, unspecified: Secondary | ICD-10-CM

## 2018-05-29 MED ORDER — PREDNISONE 10 MG (21) PO TBPK: ORAL_TABLET | ORAL | 0 refills | Status: DC

## 2018-05-29 NOTE — Patient Instructions (Addendum)
Decreased hearing in left ear.  You will be contacted to schedule appt with ENT.  Start oral prednisone as prescribed.  Continue robitussin DM as needed.  Maintain adequate oral hydration.

## 2018-05-29 NOTE — Progress Notes (Signed)
Subjective:  Patient ID: Erica HightMarissa Aguirre, female    DOB: 12/31/1995  Age: 22 y.o. MRN: [id number]  CC: Follow-up (pt c/o ear pain in both ears, left ear worse w/ hearing loss x1week , productive cough, nasal congestion. seen week ago and has not been better)   Otalgia   There is pain in both ears. This is a new problem. The current episode started 1 to 4 weeks ago. The problem occurs constantly. The problem has been unchanged. There has been no fever. Associated symptoms include coughing, hearing loss and rhinorrhea. Pertinent negatives include no abdominal pain, diarrhea, ear discharge, headaches, neck pain, rash, sore throat or vomiting. She has tried antibiotics and NSAIDs for the symptoms. The treatment provided mild relief. There is no history of a chronic ear infection, hearing loss or a tympanostomy tube.   Reviewed past Medical, Social and Family history today.  Outpatient Medications Prior to Visit  Medication Sig Dispense Refill  . acetaminophen (TYLENOL) 325 MG tablet Take by mouth as needed.     Marland Kitchen. albuterol (PROVENTIL HFA;VENTOLIN HFA) 108 (90 Base) MCG/ACT inhaler Inhale 2 puffs into the lungs every 6 (six) hours as needed for wheezing or shortness of breath. 1 Inhaler 2  . baclofen (LIORESAL) 20 MG tablet     . benzonatate (TESSALON) 200 MG capsule Take 1 capsule (200 mg total) by mouth 2 (two) times daily as needed for cough. 20 capsule 0  . HYDROcodone-homatropine (HYCODAN) 5-1.5 MG/5ML syrup Take 5 mLs by mouth at bedtime as needed for cough. 60 mL 0  . ibuprofen (ADVIL,MOTRIN) 200 MG tablet Take by mouth.    . lamoTRIgine (LAMICTAL) 25 MG tablet Take 200 mg by mouth daily. 20mg  daily     . magnesium gluconate (MAGONATE) 500 MG tablet Take 500 mg by mouth daily.    . Multiple Vitamin (MULTIVITAMIN) tablet Take 1 tablet by mouth daily.    . sodium chloride (OCEAN) 0.65 % SOLN nasal spray Place 1 spray into both nostrils as needed for congestion. 15 mL 0  . azithromycin  (ZITHROMAX Z-PAK) 250 MG tablet Take 1 tablet (250 mg total) by mouth daily. Take 2tabs on first day, then 1tab once a day till complete (Patient not taking: Reported on 05/29/2018) 6 tablet 0  . pseudoephedrine-guaifenesin (MUCINEX D) 60-600 MG 12 hr tablet Take 1 tablet by mouth every 12 (twelve) hours. (Patient not taking: Reported on 05/29/2018) 6 tablet 0   No facility-administered medications prior to visit.     ROS See HPI  Objective:  BP 126/80   Pulse (!) 106   Temp 98.5 F (36.9 C) (Oral)   Ht 5\' 8"  (1.727 m)   Wt 254 lb 3.2 oz (115.3 kg)   LMP 05/01/2018   SpO2 97%   BMI 38.65 kg/m   BP Readings from Last 3 Encounters:  05/29/18 126/80  05/23/18 110/70  05/22/18 118/78    Wt Readings from Last 3 Encounters:  05/29/18 254 lb 3.2 oz (115.3 kg)  05/22/18 255 lb (115.7 kg)  01/10/18 257 lb 9.6 oz (116.8 kg)    Physical Exam  Constitutional: No distress.  HENT:  Right Ear: External ear and ear canal normal. Tympanic membrane is erythematous. Tympanic membrane is not injected, not perforated and not bulging. A middle ear effusion is present.  Left Ear: External ear and ear canal normal. Tympanic membrane is erythematous. Tympanic membrane is not injected, not perforated and not bulging. A middle ear effusion is present.  Nose: Mucosal edema  and rhinorrhea present. Right sinus exhibits maxillary sinus tenderness. Right sinus exhibits no frontal sinus tenderness. Left sinus exhibits maxillary sinus tenderness. Left sinus exhibits no frontal sinus tenderness.  Mouth/Throat: Uvula is midline. Posterior oropharyngeal erythema present. No oropharyngeal exudate.  Neck: Normal range of motion. Neck supple.  Cardiovascular: Normal rate.  Pulmonary/Chest: Effort normal.  Lymphadenopathy:    She has no cervical adenopathy.  Skin: No rash noted.  Vitals reviewed.   Lab Results  Component Value Date   WBC 9.2 05/06/2016   HGB 14.4 05/06/2016   HCT 42.2 05/06/2016   PLT  286.0 05/06/2016   GLUCOSE 91 05/06/2016   ALT 20 05/06/2016   AST 17 05/06/2016   NA 139 05/06/2016   K 4.5 05/06/2016   CL 105 05/06/2016   CREATININE 0.72 05/06/2016   BUN 12 05/06/2016   CO2 24 05/06/2016   TSH 0.71 05/06/2016    Dg Chest 2 View  Result Date: 10/13/2017 CLINICAL DATA:  Cough.  Low-grade fever. EXAM: CHEST  2 VIEW COMPARISON:  None. FINDINGS: Normal heart size. Normal mediastinal contour. No pneumothorax. No pleural effusion. Lungs appear clear, with no acute consolidative airspace disease and no pulmonary edema. IMPRESSION: No active cardiopulmonary disease. Electronically Signed   By: Delbert PhenixJason A Poff M.D.   On: 10/13/2017 16:05    Assessment & Plan:   Erica Aguirre was seen today for follow-up.  Diagnoses and all orders for this visit:  Non-recurrent acute serous otitis media of both ears -     predniSONE (STERAPRED UNI-PAK 21 TAB) 10 MG (21) TBPK tablet; As directed on package -     Ambulatory referral to ENT  Acute non-recurrent pansinusitis -     predniSONE (STERAPRED UNI-PAK 21 TAB) 10 MG (21) TBPK tablet; As directed on package  Bilateral hearing loss, unspecified hearing loss type -     Hearing screening -     Ambulatory referral to ENT   I am having Erica Aguirre start on predniSONE. I am also having her maintain her baclofen, lamoTRIgine, acetaminophen, magnesium gluconate, multivitamin, benzonatate, HYDROcodone-homatropine, albuterol, sodium chloride, azithromycin, pseudoephedrine-guaifenesin, and ibuprofen.  Meds ordered this encounter  Medications  . predniSONE (STERAPRED UNI-PAK 21 TAB) 10 MG (21) TBPK tablet    Sig: As directed on package    Dispense:  21 tablet    Refill:  0    Order Specific Question:   Supervising Provider    Answer:   MATTHEWS, CODY [[id number]]   Follow-up: No follow-ups on file.  Erica Pennaharlotte Kirrah Mustin, NP

## 2018-12-25 ENCOUNTER — Ambulatory Visit: Payer: BC Managed Care – PPO | Admitting: Family Medicine

## 2018-12-25 ENCOUNTER — Encounter: Payer: Self-pay | Admitting: Family Medicine

## 2018-12-25 VITALS — BP 112/74 | Temp 98.9°F | Ht 68.0 in | Wt 250.0 lb

## 2018-12-25 DIAGNOSIS — J069 Acute upper respiratory infection, unspecified: Secondary | ICD-10-CM | POA: Insufficient documentation

## 2018-12-25 DIAGNOSIS — A084 Viral intestinal infection, unspecified: Secondary | ICD-10-CM | POA: Diagnosis not present

## 2018-12-25 NOTE — Assessment & Plan Note (Signed)
Likely viral. No temp greater than 100.4 and no body aches  - counseled on supportive care - given indications to follow up.

## 2018-12-25 NOTE — Assessment & Plan Note (Signed)
Symptoms seem viral. 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  - counseled on supportive care - can try imodium  - provided work note until symptom free for 24 hours  - f/u if symptoms last longer than one week. May need to perform stool studies or lab work.

## 2018-12-25 NOTE — Progress Notes (Signed)
Erica Aguirre - 23 y.o. female MRN [id number]  Date of birth: August 18, 1996  SUBJECTIVE:  Including CC & ROS.  Chief Complaint  Patient presents with  . Diarrhea    x3 days  . Sore Throat    x2 days  . Cough  . Nasal Congestion    Erica Aguirre is a 23 y.o. female that is presenting with diarrhea, cough, and congestion.  The diarrhea started on Friday.  She is having more than 10 bowel movements per day.  It is watery and nonbloody in nature.  Denies any new or different foods.  Denies any travel.  Denies any new or different medications.  Has not tried any Imodium.  Is unable to tolerate many foods by mouth.  Does have some nausea.  No rashes.  Denies any oral ulcers.  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  She also works at [employer].  The sinus congestion and cough started on Saturday and on Sunday.  The recorded temperature was 100 degrees.  Denies any myalgias.  Did not receive the flu vaccine.  Has not had any exposure to anyone with similar symptoms.    Review of Systems  HENT: Positive for congestion and sore throat.   Respiratory: Positive for cough.   Cardiovascular: Negative for chest pain.  Gastrointestinal: Positive for diarrhea and nausea. Negative for abdominal pain.  Musculoskeletal: Negative for back pain.  Skin: Negative for color change.    HISTORY: Past Medical, Surgical, Social, and Family History Reviewed & Updated per EMR.   Pertinent Historical Findings include:  Past Medical History:  Diagnosis Date  . Hypertension    h/o-no medication needed  . Migraines    will occ have aura-    Past Surgical History:  Procedure Laterality Date  . BACK SURGERY  12/01/2015    L3-L5 fusion done at [facility]   . WISDOM TOOTH EXTRACTION      Allergies  Allergen Reactions  . Metronidazole     rash rash   . Cefprozil Rash    rash  . Other Rash    [id number]    Family History  Problem Relation Age of Onset  .  Breast cancer Other        maternal cousins  . Pancreatic cancer Other        maternal side  . Ovarian cancer Other        maternal side  . Hypertension Mother        and maternal side  . Diabetes Mother        ? if diag or pre-diabetes  . Gestational diabetes Mother   . Migraines Mother        as teenager  . Hypertension Father        and paternal side  . Migraines Father   . Diabetes Maternal Grandmother   . Stroke Maternal Grandfather   . Stroke Paternal Grandmother   . Asthma Brother        sports induced when younger     Social History   Socioeconomic History  . Marital status: Single    Spouse name: Not on file  . Number of children: Not on file  . Years of education: Not on file  . Highest education level: Not on file  Occupational History  . Not on file  Social Needs  . Financial resource strain: Not on file  . Food insecurity:    Worry: Not on file    Inability:  Not on file  . Transportation needs:    Medical: Not on file    Non-medical: Not on file  Tobacco Use  . Smoking status: Never Smoker  . Smokeless tobacco: Never Used  Substance and Sexual Activity  . Alcohol use: No    Alcohol/week: 0.0 standard drinks  . Drug use: No  . Sexual activity: Never    Partners: Male    Birth control/protection: I.U.D.  Lifestyle  . Physical activity:    Days per week: Not on file    Minutes per session: Not on file  . Stress: Not on file  Relationships  . Social connections:    Talks on phone: Not on file    Gets together: Not on file    Attends religious service: Not on file    Active member of club or organization: Not on file    Attends meetings of clubs or organizations: Not on file    Relationship status: Not on file  . Intimate partner violence:    Fear of current or ex partner: Not on file    Emotionally abused: Not on file    Physically abused: Not on file    Forced sexual activity: Not on file  Other Topics Concern  . Not on file  Social  History Narrative  . Not on file     PHYSICAL EXAM:  VS: BP 112/74   Temp 98.9 F (37.2 C) (Oral)   Ht 5\' 8"  (1.727 m)   Wt 250 lb (113.4 kg)   BMI 38.01 kg/m  Physical Exam Gen: NAD, alert, cooperative with exam, ENT: normal lips, normal nasal mucosa, tympanic membranes clear and intact bilaterally, normal oropharynx, no cervical lymphadenopathy Eye: normal EOM, normal conjunctiva and lids CV:  no edema, +2 pedal pulses, regular rate and rhythm, S1-S2   Resp: no accessory muscle use, non-labored, clear to auscultation bilaterally, no crackles or wheezes GI: no masses or tenderness, no hernia, soft, +BS, non distended,  Skin: no rashes, no areas of induration  Neuro: normal tone, normal sensation to touch Psych:  normal insight, alert and oriented MSK: Normal gait, normal strength       ASSESSMENT & PLAN:   Upper respiratory tract infection Likely viral. No temp greater than 100.4 and no body aches  - counseled on supportive care - given indications to follow up.   Viral gastroenteritis Symptoms seem viral. 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  - counseled on supportive care - can try imodium  - provided work note until symptom free for 24 hours  - f/u if symptoms last longer than one week. May need to perform stool studies or lab work.

## 2018-12-25 NOTE — Patient Instructions (Signed)
Nice to meet you  Please try to stay well hydrated  Please try imodium  Please try eating bland foods  Please follow up if your symptoms last longer than 7 days.

## 2018-12-28 ENCOUNTER — Telehealth: Payer: Self-pay | Admitting: Medical

## 2018-12-28 MED ORDER — AZITHROMYCIN 250 MG PO TABS
ORAL_TABLET | ORAL | 0 refills | Status: DC
Start: 2018-12-28 — End: 2019-06-27

## 2018-12-28 NOTE — Telephone Encounter (Signed)
Spoke with patient. Will send in azithro for possible sinusitis. Diarrhea has cleared.   Myra Rude, MD [facility] 12/28/2018, 2:23 PM

## 2018-12-28 NOTE — Telephone Encounter (Signed)
Copied from CRM [id number]. Topic: General - Other >> Dec 28, 2018 10:52 AM Floria Raveling A wrote: Reason for CRM: pt called in an stated she seen Dr Jordan Likes on Monday.  This is a [facility] pt but was seen at [facility] on Monday.  She stated Dr schmitz told him to call back if she wasn't any better.  Pt stated that she is still nauseas and is super congested.  She would like to know if there is anything that he could call in for her   Pharmacy [facility] [address] [place], [place] - [address] [phone] (Phone Pt states that she now would like something for her nausea

## 2018-12-28 NOTE — Telephone Encounter (Signed)
Dr. Schmitz please advise? 

## 2019-01-23 ENCOUNTER — Emergency Department (HOSPITAL_COMMUNITY)
Admission: EM | Admit: 2019-01-23 | Discharge: 2019-01-24 | Disposition: A | Discharge status: Home / Self Care | Payer: BC Managed Care – PPO | Attending: Emergency Medicine | Admitting: Emergency Medicine

## 2019-01-23 DIAGNOSIS — Z79899 Other long term (current) drug therapy: Secondary | ICD-10-CM | POA: Insufficient documentation

## 2019-01-23 DIAGNOSIS — F10920 Alcohol use, unspecified with intoxication, uncomplicated: Secondary | ICD-10-CM | POA: Insufficient documentation

## 2019-01-23 DIAGNOSIS — T508X5A Adverse effect of diagnostic agents, initial encounter: Secondary | ICD-10-CM | POA: Insufficient documentation

## 2019-01-23 DIAGNOSIS — R1032 Left lower quadrant pain: Secondary | ICD-10-CM | POA: Diagnosis present

## 2019-01-23 DIAGNOSIS — R0789 Other chest pain: Secondary | ICD-10-CM | POA: Insufficient documentation

## 2019-01-23 DIAGNOSIS — R112 Nausea with vomiting, unspecified: Secondary | ICD-10-CM | POA: Diagnosis not present

## 2019-01-23 DIAGNOSIS — R064 Hyperventilation: Secondary | ICD-10-CM | POA: Diagnosis not present

## 2019-01-23 DIAGNOSIS — Y906 Blood alcohol level of 120-199 mg/100 ml: Secondary | ICD-10-CM | POA: Diagnosis not present

## 2019-01-23 DIAGNOSIS — R1012 Left upper quadrant pain: Secondary | ICD-10-CM | POA: Diagnosis not present

## 2019-01-23 DIAGNOSIS — L500 Allergic urticaria: Secondary | ICD-10-CM | POA: Diagnosis not present

## 2019-01-23 DIAGNOSIS — Z0441 Encounter for examination and observation following alleged adult rape: Secondary | ICD-10-CM | POA: Insufficient documentation

## 2019-01-23 LAB — CBC WITH DIFFERENTIAL/PLATELET
Abs Immature Granulocytes: 0.07 10*3/uL (ref 0.00–0.07)
Basophils Absolute: 0 10*3/uL (ref 0.0–0.1)
Basophils Relative: 0 %
Eosinophils Absolute: 0 10*3/uL (ref 0.0–0.5)
Eosinophils Relative: 0 %
HCT: 43.5 % (ref 36.0–46.0)
Hemoglobin: 15 g/dL (ref 12.0–15.0)
Immature Granulocytes: 1 %
Lymphocytes Relative: 17 %
Lymphs Abs: 1.8 10*3/uL (ref 0.7–4.0)
MCH: 29.6 pg (ref 26.0–34.0)
MCHC: 34.5 g/dL (ref 30.0–36.0)
MCV: 85.8 fL (ref 80.0–100.0)
Monocytes Absolute: 0.7 10*3/uL (ref 0.1–1.0)
Monocytes Relative: 6 %
Neutro Abs: 8.2 10*3/uL — ABNORMAL HIGH (ref 1.7–7.7)
Neutrophils Relative %: 76 %
Platelets: 296 10*3/uL (ref 150–400)
RBC: 5.07 MIL/uL (ref 3.87–5.11)
RDW: 13.2 % (ref 11.5–15.5)
WBC: 10.8 10*3/uL — ABNORMAL HIGH (ref 4.0–10.5)
nRBC: 0 % (ref 0.0–0.2)

## 2019-01-23 MED ORDER — SODIUM CHLORIDE 0.9 % IV BOLUS
1000.0000 mL | Freq: Once | INTRAVENOUS | Status: AC
  Administered 2019-01-23: 1000 mL via INTRAVENOUS

## 2019-01-23 MED ORDER — LORAZEPAM 2 MG/ML IJ SOLN
0.5000 mg | Freq: Once | INTRAMUSCULAR | Status: AC
  Administered 2019-01-23: 0.5 mg via INTRAVENOUS
  Filled 2019-01-23: qty 1

## 2019-01-23 MED ORDER — ONDANSETRON HCL 4 MG/2ML IJ SOLN
4.0000 mg | Freq: Once | INTRAMUSCULAR | Status: AC
  Administered 2019-01-23: 4 mg via INTRAVENOUS
  Filled 2019-01-23: qty 2
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
## 2019-01-23 NOTE — ED Notes (Signed)
Pt slightly desating after Ativan administration. 2 L [place] applied. Pt's sat at 100% now

## 2019-01-24 ENCOUNTER — Emergency Department (HOSPITAL_COMMUNITY): Payer: BC Managed Care – PPO

## 2019-01-24 ENCOUNTER — Ambulatory Visit (HOSPITAL_COMMUNITY)
Admission: EM | Admit: 2019-01-24 | Discharge: 2019-01-24 | Disposition: A | Discharge status: Home / Self Care | Payer: No Typology Code available for payment source | Attending: Emergency Medicine | Admitting: Emergency Medicine

## 2019-01-24 ENCOUNTER — Encounter (HOSPITAL_COMMUNITY): Payer: Self-pay | Admitting: Radiology

## 2019-01-24 ENCOUNTER — Telehealth: Payer: Self-pay | Admitting: Medical

## 2019-01-24 DIAGNOSIS — Z0441 Encounter for examination and observation following alleged adult rape: Secondary | ICD-10-CM | POA: Insufficient documentation

## 2019-01-24 LAB — RAPID HIV SCREEN (HIV 1/2 AB+AG)
HIV 1/2 Antibodies: NONREACTIVE
HIV-1 P24 Antigen - HIV24: NONREACTIVE

## 2019-01-24 LAB — COMPREHENSIVE METABOLIC PANEL
ALT: 28 U/L (ref 0–44)
AST: 19 U/L (ref 15–41)
Albumin: 4.1 g/dL (ref 3.5–5.0)
Alkaline Phosphatase: 84 U/L (ref 38–126)
Anion gap: 13 (ref 5–15)
BUN: 9 mg/dL (ref 6–20)
CO2: 16 mmol/L — ABNORMAL LOW (ref 22–32)
Calcium: 8.7 mg/dL — ABNORMAL LOW (ref 8.9–10.3)
Chloride: 108 mmol/L (ref 98–111)
Creatinine, Ser: 0.64 mg/dL (ref 0.44–1.00)
GFR calc Af Amer: >60 mL/min (ref >60)
GFR calc non Af Amer: >60 mL/min (ref >60)
Glucose, Bld: 89 mg/dL (ref 70–99)
Potassium: 3.5 mmol/L (ref 3.5–5.1)
Sodium: 137 mmol/L (ref 135–145)
Total Bilirubin: 0.4 mg/dL (ref 0.3–1.2)
Total Protein: 7.1 g/dL (ref 6.5–8.1)

## 2019-01-24 LAB — ETHANOL: Alcohol, Ethyl (B): 166 mg/dL — ABNORMAL HIGH (ref <10)

## 2019-01-24 LAB — RPR: RPR Ser Ql: NONREACTIVE

## 2019-01-24 LAB — POC URINE PREG, ED: Preg Test, Ur: NEGATIVE

## 2019-01-24 MED ORDER — LIDOCAINE HCL (PF) 1 % IJ SOLN
0.9000 mL | Freq: Once | INTRAMUSCULAR | Status: DC
  Filled 2019-01-24: qty 5

## 2019-01-24 MED ORDER — GENTAMICIN SULFATE 40 MG/ML IJ SOLN
240.0000 mg | Freq: Once | INTRAVENOUS | Status: AC
  Administered 2019-01-24: 240 mg via INTRAVENOUS
  Filled 2019-01-24: qty 6

## 2019-01-24 MED ORDER — DIPHENHYDRAMINE HCL 50 MG/ML IJ SOLN: 25.0000 mg | Freq: Once | INTRAMUSCULAR | Status: DC

## 2019-01-24 MED ORDER — ELVITEG-COBIC-EMTRICIT-TENOFAF 150-150-200-10 MG PO TABS: 1.0000 | ORAL_TABLET | Freq: Every day | ORAL | 0 refills | Status: DC

## 2019-01-24 MED ORDER — DIPHENHYDRAMINE HCL 50 MG/ML IJ SOLN
25.0000 mg | Freq: Once | INTRAMUSCULAR | Status: AC
  Administered 2019-01-24: 25 mg via INTRAVENOUS
  Filled 2019-01-24: qty 1

## 2019-01-24 MED ORDER — ULIPRISTAL ACETATE 30 MG PO TABS
30.0000 mg | ORAL_TABLET | Freq: Once | ORAL | Status: AC
  Administered 2019-01-24: 30 mg via ORAL
  Filled 2019-01-24: qty 1

## 2019-01-24 MED ORDER — GENTAMICIN SULFATE 40 MG/ML IJ SOLN
240.0000 mg | Freq: Once | INTRAMUSCULAR | Status: DC
  Filled 2019-01-24: qty 6

## 2019-01-24 MED ORDER — ELVITEG-COBIC-EMTRICIT-TENOFAF 150-150-200-10 MG PREPACK
5.0000 | ORAL_TABLET | Freq: Once | ORAL | Status: AC
  Administered 2019-01-24: 5 via ORAL
  Filled 2019-01-24: qty 5

## 2019-01-24 MED ORDER — ELVITEG-COBIC-EMTRICIT-TENOFAF 150-150-200-10 MG PREPACK
ORAL_TABLET | ORAL | Status: AC
  Filled 2019-01-24: qty 1

## 2019-01-24 MED ORDER — AZITHROMYCIN 250 MG PO TABS
1000.0000 mg | ORAL_TABLET | Freq: Once | ORAL | Status: AC
  Administered 2019-01-24: 1000 mg via ORAL
  Filled 2019-01-24: qty 4

## 2019-01-24 MED ORDER — IOHEXOL 300 MG/ML  SOLN
100.0000 mL | Freq: Once | INTRAMUSCULAR | Status: AC | PRN
  Administered 2019-01-24: 100 mL via INTRAVENOUS

## 2019-01-24 MED FILL — GENVOYA TABLET: 150-150-200 | 30 days supply | Qty: 30 | Fill #0
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
## 2019-01-24 NOTE — Telephone Encounter (Signed)
Pt evaluated in ED recently. Would you reach out to her and offer virtual visit.

## 2019-01-24 NOTE — ED Notes (Signed)
SANE RN with patient. 

## 2019-01-24 NOTE — ED Notes (Signed)
UA sent
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
## 2019-01-24 NOTE — Discharge Instructions (Signed)
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 zolpidem -certain medicines for blood pressure or heart disease like amlodipine, diltiazem, felodipine, metoprolol, nicardipine, nifedipine, timolol, verapamil -certain medicines for depression, anxiety, or psychiatric disturbances -certain medicines for erectile dysfunction like avanafil, sildenafil, tadalafil, vardenafil -certain medicines for fungal infection like itraconazole, ketoconazole, voriconazole -colchicine -cyclosporine -dexamethasone -female hormones, like estrogens and progestins and birth control pills -fluticasone -medicines for infection like acyclovir, cidofovir, valacyclovir, ganciclovir, valganciclovir -medicines for irregular heart beat like amiodarone, bepridil, digoxin, disopyramide, dofetilide, flecainide, lidocaine, mexiletine, propafenone, quinidine -metformin -oxcarbazepine -phenothiazines like  perphenazine, risperidone, thioridazine -salmeterol -sirolimus -tacrolimus -warfarin This list may not describe all possible interactions. Give your health care provider a list of all the medicines, herbs, non-prescription drugs, or dietary supplements you use. Also tell them if you smoke, drink alcohol, or use illegal drugs. Some items may interact  with your medicine.  What should I watch for while using this medicine? Visit your doctor or health care professional for regular check ups. Discuss any new symptoms with your doctor. You will need to have important blood work done while on this medicine. HIV is spread to others through sexual or blood contact. Talk to your doctor about how to stop the spread of HIV. If you have hepatitis B, talk to your doctor if you plan to stop this medicine. The symptoms of hepatitis B may get worse if you stop this medicine. Birth control pills may not work properly while you are taking this medicine. Talk to your doctor about using an extra method of birth control. Women who can still have children must use a reliable form of barrier contraception, like a condom.  What side effects may I notice from receiving this medicine? Side effects that you should report to your doctor or health care professional as soon as possible: -allergic reactions like skin rash, itching or hives, swelling of the face, lips, or tongue -breathing problems -fast, irregular heartbeat -muscle pain or weakness -signs and symptoms of kidney injury like trouble passing urine or change in the amount of urine -signs and symptoms of liver injury like dark yellow or brown urine; general ill feeling or flu-like symptoms; light-colored stools; loss of appetite; right upper belly pain; unusually weak or tired; yellowing of the eyes or skin Side effects that usually do not require medical attention (report to your doctor or health care professional if they continue or are  bothersome): -diarrhea -headache -nausea -tiredness This list may not describe all possible side effects. Call your doctor for medical advice about side effects. You may report side effects to FDA at 1-800-FDA-1088.  Where should I keep my medicine? Keep out of the reach of children. Store at room temperature below 30 degrees C (86 degrees F). Throw away any unused medicine after the expiration date. NOTE: This sheet is a summary. It may not cover all possible information. If you have questions about this medicine, talk to your doctor, pharmacist, or health care provider.  2018 Elsevier/Gold Standard (2016-07-19 12:54:04)
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
## 2019-01-24 NOTE — Telephone Encounter (Signed)
Left message on pt's voicemail to check mychart acct. Message sent. 

## 2019-01-25 LAB — HEPATITIS C ANTIBODY: HCV Ab: <0.1 s/co ratio (ref 0.0–0.9)

## 2019-01-25 LAB — HEPATITIS PANEL, ACUTE
HCV Ab: <0.1 s/co ratio (ref 0.0–0.9)
Hep A IgM: NEGATIVE
Hep B C IgM: NEGATIVE
Hepatitis B Surface Ag: NEGATIVE

## 2019-06-26 ENCOUNTER — Other Ambulatory Visit: Payer: Self-pay

## 2019-06-27 ENCOUNTER — Encounter: Payer: Self-pay | Admitting: Obstetrics & Gynecology

## 2019-06-27 ENCOUNTER — Ambulatory Visit: Payer: BC Managed Care – PPO | Admitting: Obstetrics & Gynecology

## 2019-06-27 ENCOUNTER — Other Ambulatory Visit (HOSPITAL_COMMUNITY)
Admission: RE | Admit: 2019-06-27 | Discharge: 2019-06-27 | Disposition: A | Discharge status: Home / Self Care | Payer: BC Managed Care – PPO | Source: Ambulatory Visit | Attending: Obstetrics & Gynecology | Admitting: Obstetrics & Gynecology

## 2019-06-27 VITALS — BP 130/76 | HR 84 | Temp 98.8°F | Ht 68.0 in | Wt 235.0 lb

## 2019-06-27 DIAGNOSIS — Z124 Encounter for screening for malignant neoplasm of cervix: Secondary | ICD-10-CM | POA: Insufficient documentation

## 2019-06-27 DIAGNOSIS — Z113 Encounter for screening for infections with a predominantly sexual mode of transmission: Secondary | ICD-10-CM

## 2019-06-27 DIAGNOSIS — Z01419 Encounter for gynecological examination (general) (routine) without abnormal findings: Secondary | ICD-10-CM | POA: Diagnosis not present

## 2019-06-27 DIAGNOSIS — Z23 Encounter for immunization: Secondary | ICD-10-CM | POA: Diagnosis not present

## 2019-06-27 MED ORDER — NORETHINDRONE 0.35 MG PO TABS: 1.0000 | ORAL_TABLET | Freq: Every day | ORAL | 3 refills | Status: DC

## 2019-06-27 NOTE — Progress Notes (Signed)
23 y.o. G0P0000 Single White or Caucasian female here for annual exam.  She had an ER visit in April.  Now confidential patient.  Doesn't really want to talk about it.  She cycles every months.  Flow lasts about 7 days.  Reports her cycles got much better for the past three years.  In the last few months, she's having more issues with nausea, cramping, low back pain and headaches.  She actually stayed in bed last months.    Was released from Dr. Gerome Apley, ortho, last year in September.  Neuruology:  Dr. Devoria Albe  Patient's last menstrual period was 06/08/2019 (exact date).          Sexually active: No.  The current method of family planning is IUD.  Kyleena placed 05/13/16  Exercising: Yes.    walking Smoker:  no  Health Maintenance: Pap:  10/15/16 Neg  History of abnormal Pap:  no MMG:  Never TDaP:  due Gardasil: completed  Hep C testing: 01/24/19 Neg Screening Labs: PCP   reports that she has never smoked. She has never used smokeless tobacco. She reports that she does not drink alcohol or use drugs.  Past Medical History:  Diagnosis Date  . Hypertension    h/o-no medication needed  . Migraines    will occ have aura-    Past Surgical History:  Procedure Laterality Date  . BACK SURGERY  12/01/2015    L3-L5 fusion done at [facility]   . WISDOM TOOTH EXTRACTION      Current Outpatient Medications  Medication Sig Dispense Refill  . acetaminophen (TYLENOL) 325 MG tablet Take by mouth as needed.     . baclofen (LIORESAL) 20 MG tablet     . Cholecalciferol (VITAMIN D3) 10 MCG (400 UNIT) CAPS Take by mouth.    Marland Kitchen ibuprofen (ADVIL,MOTRIN) 200 MG tablet Take by mouth.    . lamoTRIgine (LAMICTAL) 200 MG tablet Take 1 tablet by mouth daily.    . magnesium gluconate (MAGONATE) 500 MG tablet Take 500 mg by mouth daily.    . Multiple Vitamin (MULTIVITAMIN) tablet Take 1 tablet by mouth daily.     No current facility-administered medications for this visit.     Family History   Problem Relation Age of Onset  . Breast cancer Other        maternal cousins  . Pancreatic cancer Other        maternal side  . Ovarian cancer Other        maternal side  . Hypertension Mother        and maternal side  . Diabetes Mother        ? if diag or pre-diabetes  . Gestational diabetes Mother   . Migraines Mother        as teenager  . Hypertension Father        and paternal side  . Migraines Father   . Diabetes Maternal Grandmother   . Stroke Maternal Grandfather   . Stroke Paternal Grandmother   . Asthma Brother        sports induced when younger    Review of Systems  Genitourinary: Positive for menstrual problem (nausea, headaches, diarrhea, cramping).  All other systems reviewed and are negative.   Exam:   BP 130/76   Pulse 84   Temp 98.8 F (37.1 C) (Temporal)   Ht 5\' 8"  (1.727 m)   Wt 235 lb (106.6 kg)   LMP 06/08/2019 (Exact Date)   BMI 35.73 kg/m  Height: 5\' 8"  (172.7 cm)  Ht Readings from Last 3 Encounters:  06/27/19 5\' 8"  (1.727 m)  12/25/18 5\' 8"  (1.727 m)  05/29/18 5\' 8"  (1.727 m)    General appearance: alert, cooperative and appears stated age Head: Normocephalic, without obvious abnormality, atraumatic Neck: no adenopathy, supple, symmetrical, trachea midline and thyroid normal to inspection and palpation Lungs: clear to auscultation bilaterally Breasts: normal appearance, no masses or tenderness Heart: regular rate and rhythm Abdomen: soft, non-tender; bowel sounds normal; no masses,  no organomegaly Extremities: extremities normal, atraumatic, no cyanosis or edema Skin: Skin color, texture, turgor normal. No rashes or lesions Lymph nodes: Cervical, supraclavicular, and axillary nodes normal. No abnormal inguinal nodes palpated Neurologic: Grossly normal   Pelvic: External genitalia:  no lesions              Urethra:  normal appearing urethra with no masses, tenderness or lesions              Bartholins and Skenes: normal                  Vagina: normal appearing vagina with normal color and discharge, no lesions              Cervix: no lesions              Pap taken: Yes.   Bimanual Exam:  Uterus:  normal size, contour, position, consistency, mobility, non-tender              Adnexa: normal adnexa and no mass, fullness, tenderness               Rectovaginal: Confirms               Anus:  normal sphincter tone, no lesions  Chaperone was present for exam.  A:  Well Woman with normal exam Dysmenorrhea, worse over last few months H/o Kyleena placement 2017 Migraines with aura, cannot use estrogen products  P:   Mammogram guidelines reviewed. pap smear obtained today with Gc/Chl, trich testing Trial of micronor 1 po q day.  #3 months supply/4RF.  If helps, can stay on this with IUD.  If not, consider PUS and replacment of Kyleena or with Mirena.  Pt will call and give update in two to three months. Tdap updated today. No lab work obtained today. return annually or prn

## 2019-06-29 LAB — CYTOLOGY - PAP
Adequacy: ABSENT
Chlamydia: NEGATIVE
Diagnosis: NEGATIVE
Neisseria Gonorrhea: NEGATIVE
Trichomonas: NEGATIVE

## 2019-08-28 IMAGING — CT CT ABDOMEN AND PELVIS WITH CONTRAST
2 of 4 series · 17 of 46 positions shown, 19 images · IV contrast (omnipaque)
Comparison: None available.

CLINICAL DATA: Initial evaluation for acute nausea, vomiting, left
flank pain.

EXAM:
CT ABDOMEN AND PELVIS WITH CONTRAST
TECHNIQUE: Multidetector CT imaging of the abdomen and pelvis was performed
using the standard protocol following bolus administration of
intravenous contrast.
CONTRAST:  100mL OMNIPAQUE IOHEXOL 300 MG/ML  SOLN

[Series 3: abdomen 5.0 · axial · 0.97mm/px · z∈[+618,+1074]mm · 14 of 103 slices shown, 16 images]
[im 6/103  soft-tissue]
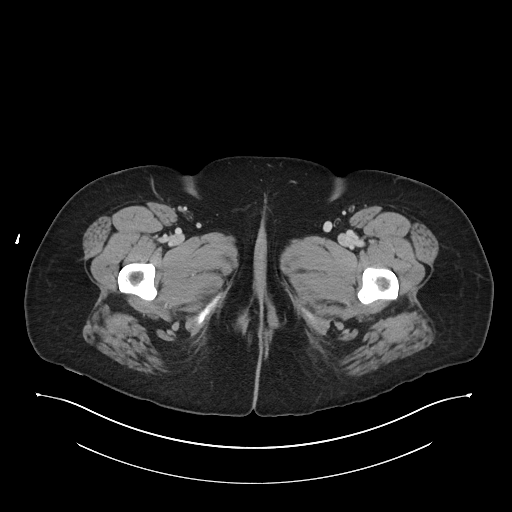
[im 6/103  bone]
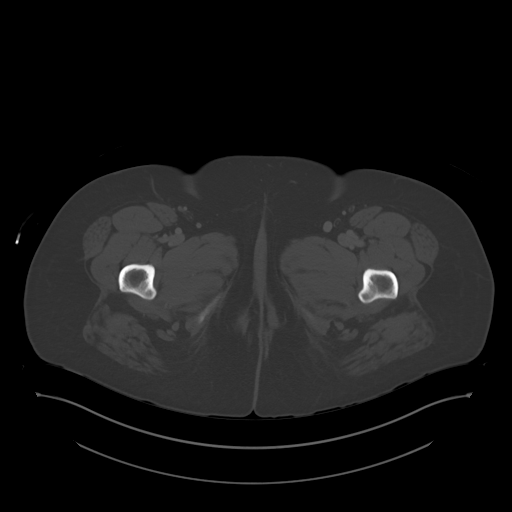
[im 11/103  soft-tissue]
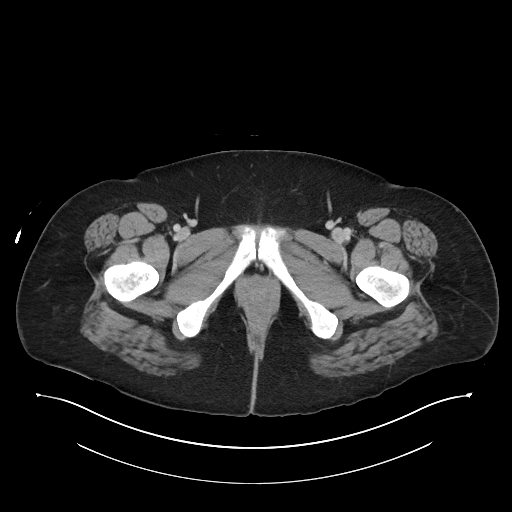
[im 22/103  soft-tissue]
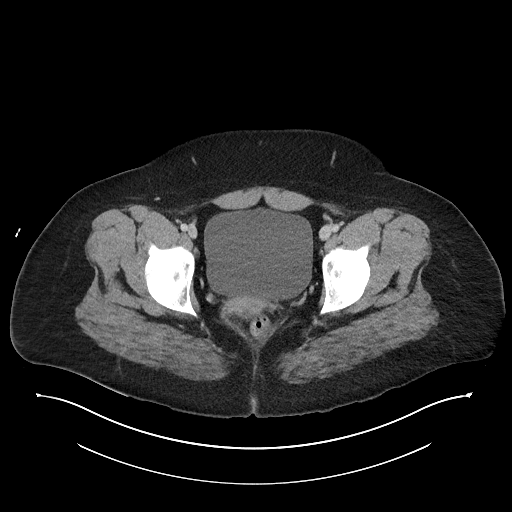
[im 27/103  soft-tissue]
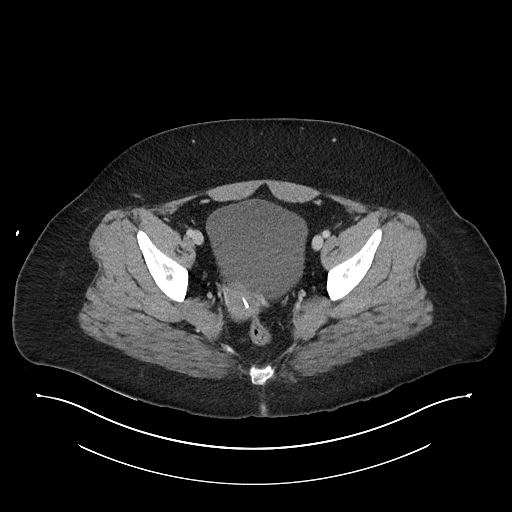
[im 33/103  soft-tissue]
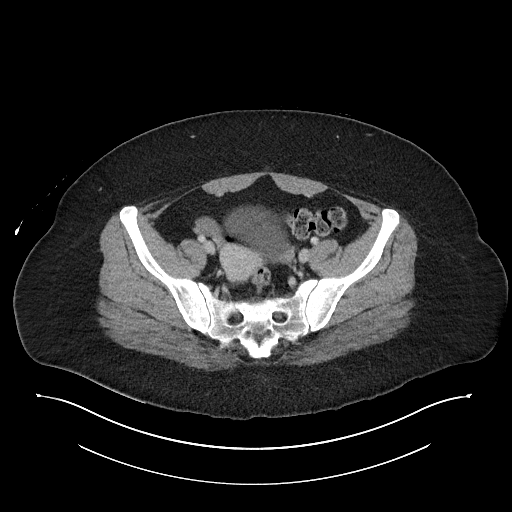
[im 43/103  soft-tissue]
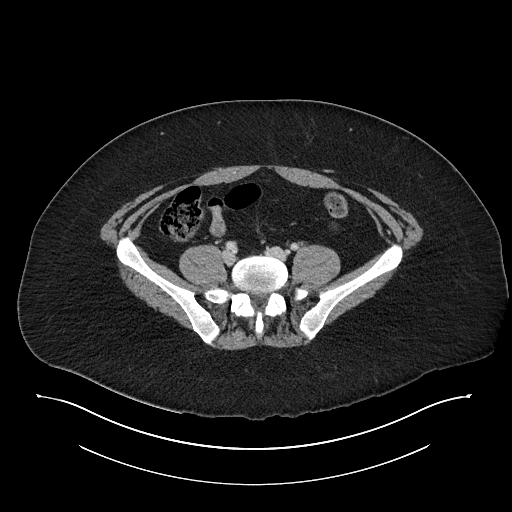
[im 49/103  soft-tissue]
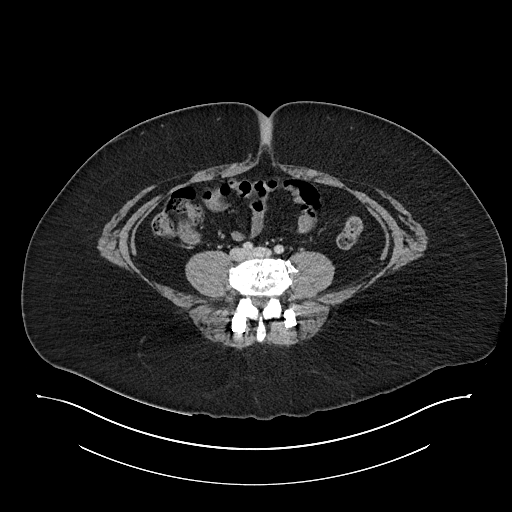
[im 54/103  soft-tissue]
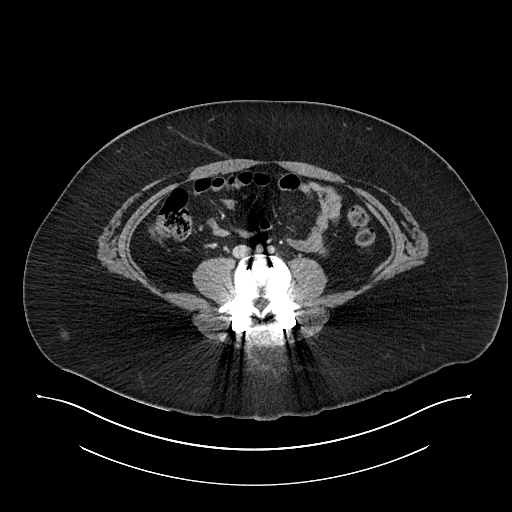
[im 60/103  soft-tissue]
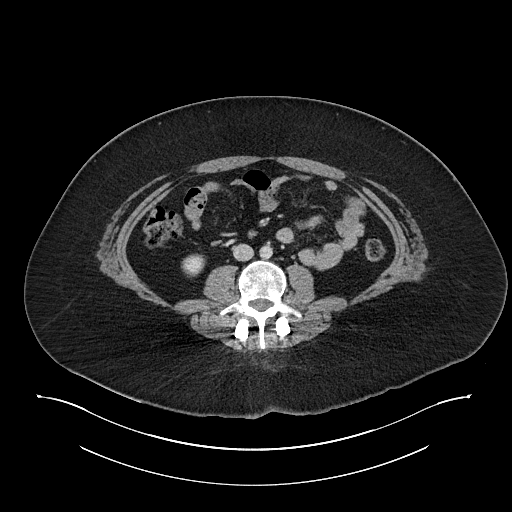
[im 60/103  bone]
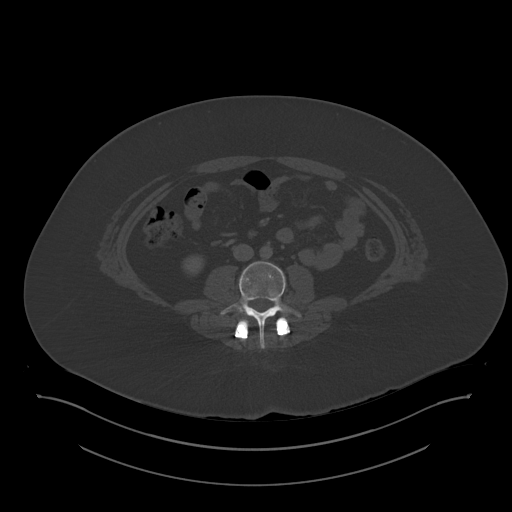
[im 70/103  soft-tissue]
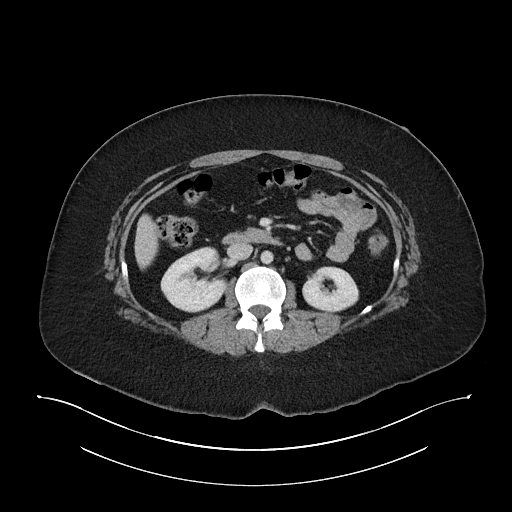
[im 76/103  soft-tissue]
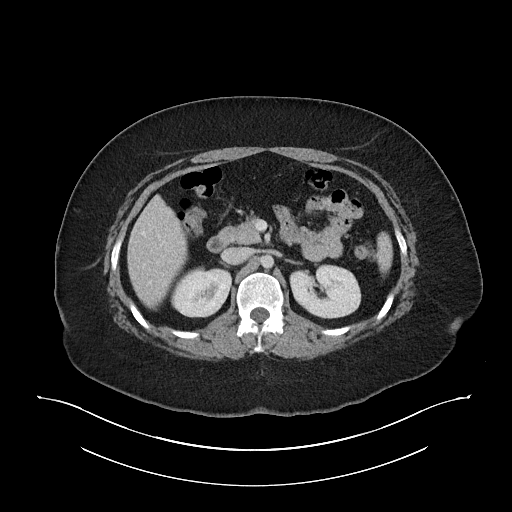
[im 81/103  soft-tissue]
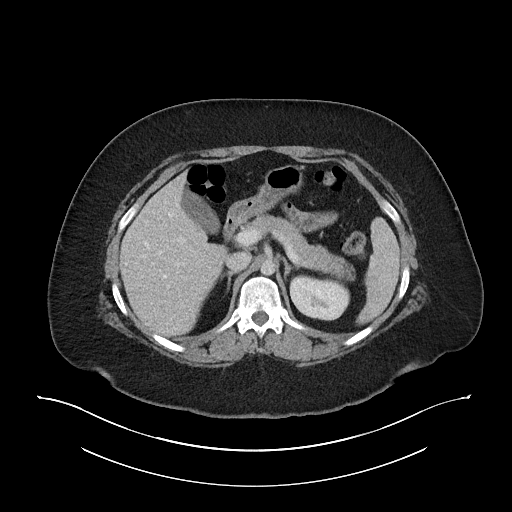
[im 92/103  soft-tissue]
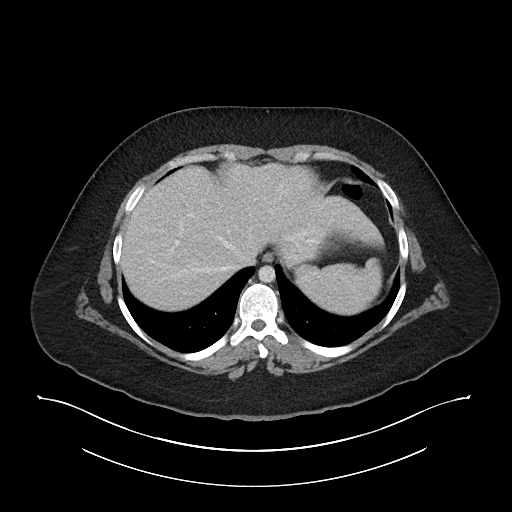
[im 97/103  soft-tissue]
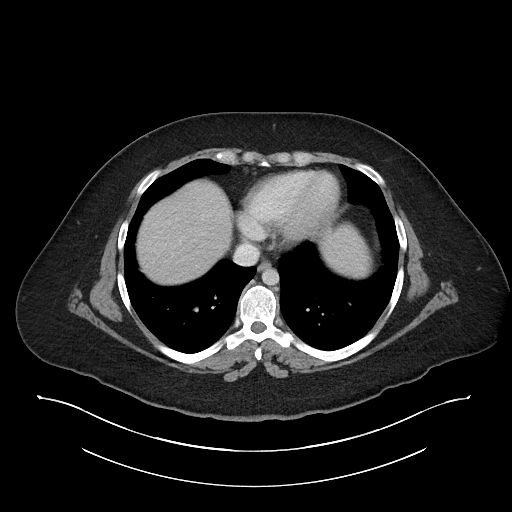

[Series 6: abdomen 3.0 mpr cor · coronal · 1.00mm/px · 3 of 125 slices shown]
[im 42/125  soft-tissue]
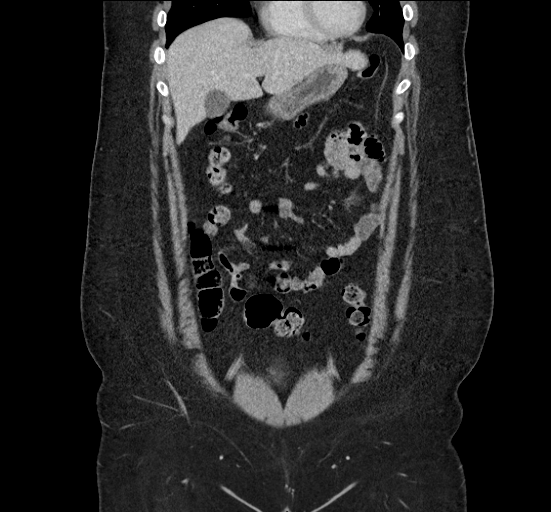
[im 56/125  soft-tissue]
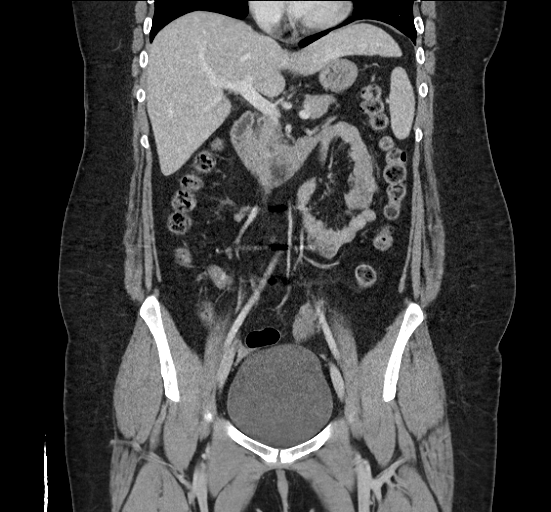
[im 69/125  soft-tissue]
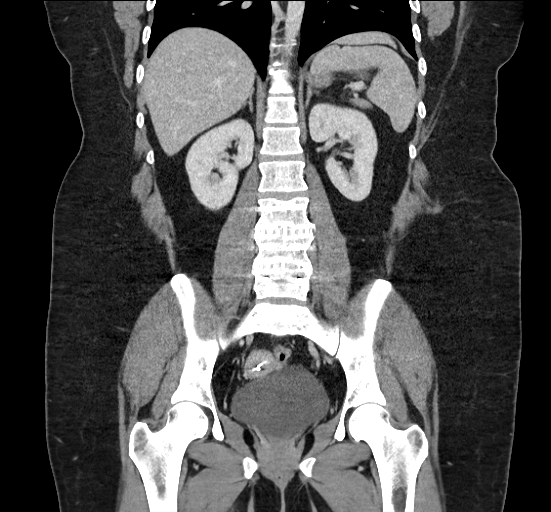

[17 of 46 positions shown; findings below may reference images not displayed]

FINDINGS: Lower chest: Visualized lung bases are clear.

Hepatobiliary: Liver demonstrates a normal contrast enhanced
appearance. Gallbladder within normal limits. No biliary dilatation.

Pancreas: Pancreas within normal limits.

Spleen: Spleen within normal limits.

Adrenals/Urinary Tract: Adrenal glands are normal. Kidneys equal in
size with symmetric enhancement. No nephrolithiasis, hydronephrosis,
or focal enhancing renal mass. No hydroureter. Partially distended
bladder within normal limits.

Stomach/Bowel: Stomach within normal limits. No evidence for bowel
obstruction. Normal appendix. No acute inflammatory changes seen
about the bowels.

Vascular/Lymphatic: Normal intravascular enhancement seen throughout
the intra-abdominal aorta. Mesenteric vessels patent proximally. No
adenopathy.

Reproductive: IUD in place within the uterus. Uterus and ovaries
otherwise unremarkable.

Other: No free air or fluid. Small fat containing paraumbilical
hernia noted without associated inflammation.

Musculoskeletal: Sequelae of prior PLIF at L3-4 and L4-5. No acute
osseous abnormality. No discrete lytic or blastic osseous lesions.
IMPRESSION: No CT evidence for acute intra-abdominal or pelvic process. No
findings to explain patient's symptoms identified.

## 2019-10-16 ENCOUNTER — Encounter

## 2020-01-30 ENCOUNTER — Other Ambulatory Visit: Payer: Self-pay

## 2020-01-30 ENCOUNTER — Telehealth (INDEPENDENT_AMBULATORY_CARE_PROVIDER_SITE_OTHER): Payer: BC Managed Care – PPO | Admitting: Medical

## 2020-01-30 DIAGNOSIS — M791 Myalgia, unspecified site: Secondary | ICD-10-CM | POA: Diagnosis not present

## 2020-01-30 DIAGNOSIS — R509 Fever, unspecified: Secondary | ICD-10-CM | POA: Diagnosis not present

## 2020-01-30 NOTE — Progress Notes (Signed)
Subjective:    Patient ID: Erica Aguirre, female    DOB: Sep 07, 1996, 24 y.o.   MRN: [id number]  HPI  Pt staes for about one week she has felt feverish with diffuse body aches. States diffuse pain. Pain is worse around her joints. No throat pain. No swollen lymph nodes.   She states has hx of chronic ha. No usual headache recently.  She has 2 weeks of mild  nasal congestion. Hx of allergies. No sinus pressure. no teeth pain.  Has been using flonase nasal spray. No antihistamine. No colored mucus reported.  She is taking tylenol   Pt works at [employer].  Pt states fever consistently daily. Sunday no fever. Fever would respond to tylenol only partially. Even with tylenol would bring down to 100.4.  LMP- 3 weeks ago.  No bp or pulse checked today.    Review of Systems  Constitutional: Positive for fatigue and fever. Negative for chills.  HENT: Positive for congestion.        Mild nasal congestion.  Respiratory: Negative for cough, chest tightness, shortness of breath and wheezing.   Cardiovascular: Negative for chest pain and palpitations.  Gastrointestinal: Negative for abdominal pain.  Musculoskeletal:       Diffuse pain but at times more in joints.Some bilateral knee pains. Also some hip pain.  Hematological: Negative for adenopathy. Does not bruise/bleed easily.  Psychiatric/Behavioral: Negative for behavioral problems and confusion.   Past Medical History:  Diagnosis Date  . Hypertension    h/o-no medication needed  . Migraines    will occ have aura-     Social History   Socioeconomic History  . Marital status: Single    Spouse name: Not on file  . Number of children: Not on file  . Years of education: Not on file  . Highest education level: Not on file  Occupational History  . Not on file  Tobacco Use  . Smoking status: Never Smoker  . Smokeless tobacco: Never Used  Substance and Sexual Activity  . Alcohol use: No    Alcohol/week: 0.0 standard drinks   . Drug use: No  . Sexual activity: Not Currently    Partners: Male    Birth control/protection: I.U.D.  Other Topics Concern  . Not on file  Social History Narrative  . Not on file   Social Determinants of Health   Financial Resource Strain:   . Difficulty of Paying Living Expenses:   Food Insecurity:   . Worried About Programme researcher, broadcasting/film/video in the Last Year:   . Barista in the Last Year:   Transportation Needs:   . Freight forwarder (Medical):   Marland Kitchen Lack of Transportation (Non-Medical):   Physical Activity:   . Days of Exercise per Week:   . Minutes of Exercise per Session:   Stress:   . Feeling of Stress :   Social Connections:   . Frequency of Communication with Friends and Family:   . Frequency of Social Gatherings with Friends and Family:   . Attends Religious Services:   . Active Member of Clubs or Organizations:   . Attends Banker Meetings:   Marland Kitchen Marital Status:   Intimate Partner Violence:   . Fear of Current or Ex-Partner:   . Emotionally Abused:   Marland Kitchen Physically Abused:   . Sexually Abused:     Past Surgical History:  Procedure Laterality Date  . BACK SURGERY  12/01/2015    L3-L5 fusion done at  [place]   . WISDOM TOOTH EXTRACTION      Family History  Problem Relation Age of Onset  . Breast cancer Other        maternal cousins  . Pancreatic cancer Other        maternal side  . Ovarian cancer Other        maternal side  . Hypertension Mother        and maternal side  . Diabetes Mother        ? if diag or pre-diabetes  . Gestational diabetes Mother   . Migraines Mother        as teenager  . Hypertension Father        and paternal side  . Migraines Father   . Diabetes Maternal Grandmother   . Stroke Maternal Grandfather   . Stroke Paternal Grandmother   . Asthma Brother        sports induced when younger    Allergies  Allergen Reactions  . Metronidazole     rash rash   . Cefprozil Rash    rash  . Contrast Media  [Iodinated Diagnostic Agents] Rash  . Other Rash    [id number]    Current Outpatient Medications on File Prior to Visit  Medication Sig Dispense Refill  . acetaminophen (TYLENOL) 325 MG tablet Take by mouth as needed.     . baclofen (LIORESAL) 20 MG tablet     . Cholecalciferol (VITAMIN D3) 10 MCG (400 UNIT) CAPS Take by mouth.    . gabapentin (NEURONTIN) 300 MG capsule     . ibuprofen (ADVIL,MOTRIN) 200 MG tablet Take by mouth.    . lamoTRIgine (LAMICTAL) 200 MG tablet Take 1 tablet by mouth daily.    . Magnesium (V-R MAGNESIUM) 250 MG TABS Take by mouth.    . magnesium gluconate (MAGONATE) 500 MG tablet Take 500 mg by mouth daily.    . Multiple Vitamin (MULTIVITAMIN) tablet Take 1 tablet by mouth daily.    . norethindrone (MICRONOR) 0.35 MG tablet Take 1 tablet (0.35 mg total) by mouth daily. 3 Package 3   No current facility-administered medications on file prior to visit.    LMP 12/17/2019 (Approximate)       Objective:   Physical Exam General-no acute distress, pleasant, oriented. Lungs- on inspection lungs appear unlabored. Neck- no tracheal deviation or jvd on inspection. Neuro- gross motor function appears intact.       Assessment & Plan:  Patient has recent fever with no obvious associated signs and symptoms indicating source.  She does report diffuse myalgias and some arthralgias.  Visit had to be done a virtual/video and this makes diagnosis major challenge.  This was discussed with patient.  I do want her to get Covid test through [facility] either this afternoon or tomorrow morning first thing.  If she has worsening or changing symptoms to notify me.  Particular she has new signs/symptoms that might indicate source of potential infection.   Asked her to please call office or MyChart when she gets the Covid test result.  If Covid test result is negative then will try to have her come in for an office visit for physical exam and start work.  Under the  circumstances I will likely have to talk with management and see if this will be allowed.  During the interim if patient signs symptoms worsen pending test results then would advise ED evaluation.  Follow-up date to be determined pending Covid test results.  For  fever I did advise using Tylenol and if fever persist then alternate ibuprofen as directed.  Stay hydrated.  Work note sent to [employer].  Time spent with patient today was 25  minutes which consisted of  discussing  dfferential diagnosis, work up,  Treatment presently  and documentation.

## 2020-01-30 NOTE — Patient Instructions (Signed)
Patient has recent fever with no obvious associated signs and symptoms indicating source.  She does report diffuse myalgias and some arthralgias.  Visit had to be done a virtual/video and this makes diagnosis major challenge.  This was discussed with patient.  I do want her to get [facility] test [facility] either this afternoon or tomorrow morning first thing.  If she has worsening or changing symptoms to notify me.  Particular she has new signs/symptoms that might indicate source of potential infection.   Asked her to please call office or MyChart when she gets the Covid test result.  If Covid test result is negative then will try to have her come in for an office visit for physical exam and start work.  Under the circumstances I will likely have to talk with management and see if this will be allowed.  During the interim if patient signs symptoms worsen pending test results then would advise ED evaluation.  Follow-up date to be determined pending Covid test results.  For fever I did advise using Tylenol and if fever persist then alternate ibuprofen as directed.  Stay hydrated.  Work note sent to [employer].

## 2020-01-31 ENCOUNTER — Other Ambulatory Visit: Payer: Self-pay

## 2020-02-01 ENCOUNTER — Encounter: Payer: Self-pay | Admitting: Medical

## 2020-02-01 ENCOUNTER — Other Ambulatory Visit: Payer: Self-pay

## 2020-02-01 ENCOUNTER — Ambulatory Visit (HOSPITAL_BASED_OUTPATIENT_CLINIC_OR_DEPARTMENT_OTHER)
Admission: RE | Admit: 2020-02-01 | Discharge: 2020-02-01 | Disposition: A | Discharge status: Home / Self Care | Payer: BC Managed Care – PPO | Source: Ambulatory Visit | Attending: Medical | Admitting: Medical

## 2020-02-01 ENCOUNTER — Ambulatory Visit: Payer: BC Managed Care – PPO | Admitting: Medical

## 2020-02-01 VITALS — BP 128/61 | HR 87 | Temp 98.7°F | Resp 16 | Ht 68.0 in | Wt 229.6 lb

## 2020-02-01 DIAGNOSIS — R1012 Left upper quadrant pain: Secondary | ICD-10-CM

## 2020-02-01 DIAGNOSIS — R5383 Other fatigue: Secondary | ICD-10-CM

## 2020-02-01 DIAGNOSIS — R509 Fever, unspecified: Secondary | ICD-10-CM

## 2020-02-01 DIAGNOSIS — M2550 Pain in unspecified joint: Secondary | ICD-10-CM | POA: Diagnosis not present

## 2020-02-01 DIAGNOSIS — M791 Myalgia, unspecified site: Secondary | ICD-10-CM

## 2020-02-01 LAB — TSH: TSH: 1.4 u[IU]/mL (ref 0.35–4.50)

## 2020-02-01 LAB — COMPREHENSIVE METABOLIC PANEL
ALT: 32 U/L (ref 0–35)
AST: 20 U/L (ref 0–37)
Albumin: 4.4 g/dL (ref 3.5–5.2)
Alkaline Phosphatase: 58 U/L (ref 39–117)
BUN: 16 mg/dL (ref 6–23)
CO2: 24 mEq/L (ref 19–32)
Calcium: 9 mg/dL (ref 8.4–10.5)
Chloride: 105 mEq/L (ref 96–112)
Creatinine, Ser: 0.64 mg/dL (ref 0.40–1.20)
GFR: 113.75 mL/min (ref >60.00)
Glucose, Bld: 84 mg/dL (ref 70–99)
Potassium: 4.3 mEq/L (ref 3.5–5.1)
Sodium: 138 mEq/L (ref 135–145)
Total Bilirubin: 0.4 mg/dL (ref 0.2–1.2)
Total Protein: 6.7 g/dL (ref 6.0–8.3)

## 2020-02-01 LAB — LIPASE: Lipase: 16 U/L (ref 11.0–59.0)

## 2020-02-01 LAB — CBC
HCT: 41.8 % (ref 36.0–46.0)
Hemoglobin: 14.3 g/dL (ref 12.0–15.0)
MCHC: 34.2 g/dL (ref 30.0–36.0)
MCV: 87.6 fl (ref 78.0–100.0)
Platelets: 225 10*3/uL (ref 150.0–400.0)
RBC: 4.77 Mil/uL (ref 3.87–5.11)
RDW: 13.2 % (ref 11.5–15.5)
WBC: 7.3 10*3/uL (ref 4.0–10.5)

## 2020-02-01 LAB — SEDIMENTATION RATE: Sed Rate: 20 mm/hr (ref 0–20)

## 2020-02-01 LAB — C-REACTIVE PROTEIN: CRP: <1 mg/dL (ref 0.5–20.0)

## 2020-02-01 NOTE — Patient Instructions (Signed)
You have recent fever, myalgias, arthralgias and fatigue.  Work-up at [facility] showed negative rapid Covid test and negative rapid mono test.  Do recommend that you call back to the clinic and see if they did a send out Covid test.  You have mild nasal congestion and continue allergy meds over-the-counter.  For fever of undetermined origin, I ordered the tests listed labs today.  Also since you do have some left upper quadrant tenderness decided to go ahead and get ultrasound today to evaluate if you have any enlarged spleen.  I want you to not take any Tylenol and ibuprofen continue check your temperature today to see if fever occurring.  If temperature above 99.6 then update me via MyChart.  After the test labs and ultrasound are done if fever or joint pains are persisting and studies negative then I might give a trial course of a azithromycin antibiotic.  In the event of low level sinus infection or possible atypical strep type presentation.  Present during the pandemic or not able to do strep test/cultures.  Follow-up 7 to 10 days or as needed.

## 2020-02-01 NOTE — Progress Notes (Signed)
Subjective:    Patient ID: Erica Aguirre, female    DOB: [birth year], 24 y.o.   MRN: [id number]  HPI   Pt in for follow up.  Pt saw me the other day for fever. See note. [facility] and [facility] test and mono test. Pt states both negative. Her covid test came back negative in 40 minutes then [facility].  Pt has not had any sore throat. But she has been fatigued and her body has ached.   Pt denies any hx of mono in the past.  Last 2 days no longer temp 100 or above. Most T checks have been 99.   Not temp today. No tylenol or ibuprofen either.  No known tick bites. Has not been in the woods.  LMP- 3 weeks ago.    Review of Systems  Constitutional: Negative for chills and fatigue.  HENT: Positive for congestion. Negative for nosebleeds, rhinorrhea, sinus pressure, sinus pain and tinnitus.   Respiratory: Negative for cough, chest tightness, shortness of breath and wheezing.   Cardiovascular: Negative for chest pain and palpitations.  Gastrointestinal: Negative for abdominal pain.  Musculoskeletal: Negative for back pain and myalgias.  Neurological: Negative for dizziness, seizures, speech difficulty, weakness and light-headedness.  Hematological: Negative for adenopathy. Does not bruise/bleed easily.  Psychiatric/Behavioral: Negative for behavioral problems, confusion and sleep disturbance. The patient is not nervous/anxious.     Past Medical History:  Diagnosis Date  . Hypertension    h/o-no medication needed  . Migraines    will occ have aura-     Social History   Socioeconomic History  . Marital status: Single    Spouse name: Not on file  . Number of children: Not on file  . Years of education: Not on file  . Highest education level: Not on file  Occupational History  . Not on file  Tobacco Use  . Smoking status: Never Smoker  . Smokeless tobacco: Never Used  Substance and Sexual Activity  . Alcohol use: No   Alcohol/week: 0.0 standard drinks  . Drug use: No  . Sexual activity: Not Currently    Partners: Male    Birth control/protection: I.U.D.  Other Topics Concern  . Not on file  Social History Narrative  . Not on file   Social Determinants of Health   Financial Resource Strain:   . Difficulty of Paying Living Expenses:   Food Insecurity:   . Worried About Charity fundraiser in the Last Year:   . Arboriculturist in the Last Year:   Transportation Needs:   . Film/video editor (Medical):   Marland Kitchen Lack of Transportation (Non-Medical):   Physical Activity:   . Days of Exercise per Week:   . Minutes of Exercise per Session:   Stress:   . Feeling of Stress :   Social Connections:   . Frequency of Communication with Friends and Family:   . Frequency of Social Gatherings with Friends and Family:   . Attends Religious Services:   . Active Member of Clubs or Organizations:   . Attends Archivist Meetings:   Marland Kitchen Marital Status:   Intimate Partner Violence:   . Fear of Current or Ex-Partner:   . Emotionally Abused:   Marland Kitchen Physically Abused:   . Sexually Abused:     Past Surgical History:  Procedure Laterality Date  . BACK SURGERY  12/01/2015    L3-L5 fusion [facility] at [facility]   .  WISDOM TOOTH EXTRACTION      Family History  Problem Relation Age of Onset  . Breast cancer Other        maternal cousins  . Pancreatic cancer Other        maternal side  . Ovarian cancer Other        maternal side  . Hypertension Mother        and maternal side  . Diabetes Mother        ? if diag or pre-diabetes  . Gestational diabetes Mother   . Migraines Mother        as teenager  . Hypertension Father        and paternal side  . Migraines Father   . Diabetes Maternal Grandmother   . Stroke Maternal Grandfather   . Stroke Paternal Grandmother   . Asthma Brother        sports induced when younger    Allergies  Allergen Reactions  . Metronidazole     rash rash   .  Cefprozil Rash    rash  . Contrast Media [Iodinated Diagnostic Agents] Rash  . Other Rash    [id number]    Current Outpatient Medications on File Prior to Visit  Medication Sig Dispense Refill  . acetaminophen (TYLENOL) 325 MG tablet Take by mouth as needed.     . baclofen (LIORESAL) 20 MG tablet     . Cholecalciferol (VITAMIN D3) 10 MCG (400 UNIT) CAPS Take by mouth.    . gabapentin (NEURONTIN) 300 MG capsule     . ibuprofen (ADVIL,MOTRIN) 200 MG tablet Take by mouth.    . lamoTRIgine (LAMICTAL) 200 MG tablet Take 1 tablet by mouth daily.    . Magnesium (V-R MAGNESIUM) 250 MG TABS Take by mouth.    . magnesium gluconate (MAGONATE) 500 MG tablet Take 500 mg by mouth daily.    . Multiple Vitamin (MULTIVITAMIN) tablet Take 1 tablet by mouth daily.    . norethindrone (MICRONOR) 0.35 MG tablet Take 1 tablet (0.35 mg total) by mouth daily. 3 Package 3   No current facility-administered medications on file prior to visit.    BP 128/61 (BP Location: Left Arm, Patient Position: Sitting, Cuff Size: Large)   Pulse 87   Temp 98.7 F (37.1 C) (Temporal) Comment (Src): 99.1 oral  Resp 16   Ht 5\' 8"  (1.727 m)   Wt 229 lb 9.6 oz (104.1 kg)   LMP 01/15/2020 (Approximate)   SpO2 100%   BMI 34.91 kg/m       Objective:   Physical Exam  General Mental Status- Alert. General Appearance- Not in acute distress.   Skin General: Color- Normal Color. Moisture- Normal Moisture.  Neck Carotid Arteries- Normal color. Moisture- Normal Moisture. No carotid bruits. No JVD.  Chest and Lung Exam Auscultation: Breath Sounds:-Normal.  Cardiovascular Auscultation:Rythm- Regular. Murmurs & Other Heart Sounds:Auscultation of the heart reveals- No Murmurs.  Abdomen Inspection:-Inspeection Normal. Palpation/Percussion:Note:No mass. Palpation and Percussion of the abdomen reveal- faint Tender luq. I thought maybe spleen enlarged, Non Distended + BS, no rebound or  guarding.   Neurologic Cranial Nerve exam:- CN III-XII intact(No nystagmus), symmetric smile. Strength:- 5/5 equal and symmetric strength both upper and lower extremities.   heent- no sinus pressure. Throat looks normal. No lymphadenopathy.      Assessment & Plan:  You have recent fever, myalgias, arthralgias and fatigue.  Work-up at [facility] showed negative rapid Covid test and negative rapid mono test.  Do recommend that  you call back to the clinic and see if they did a send out Covid test.  You have mild nasal congestion and continue allergy meds over-the-counter.  For fever of undetermined origin, I ordered the tests listed labs today.  Also since you do have some left upper quadrant tenderness decided to go ahead and get ultrasound today to evaluate if you have any enlarged spleen.  I want you to not take any Tylenol and ibuprofen continue check your temperature today to see if fever occurring.  If temperature above 99.6 then update me via MyChart.  After the test labs and ultrasound are done if fever or joint pains are persisting and studies negative then I might give a trial course of a azithromycin antibiotic.  In the event of low level sinus infection or possible atypical strep type presentation.  Present during the pandemic or not able to do strep test/cultures.  Follow-up 7 to 10 days or as needed.  Time spent with patient today was 40  minutes which consisted of chart review, discussing  Differential diagnosis, work up ,treatment and documentation.

## 2020-02-01 NOTE — Addendum Note (Signed)
Addended by: Mervin Kung A on: 02/01/2020 01:25 PM   Modules accepted: Orders

## 2020-02-04 ENCOUNTER — Telehealth: Payer: Self-pay | Admitting: Medical

## 2020-02-04 DIAGNOSIS — R768 Other specified abnormal immunological findings in serum: Secondary | ICD-10-CM

## 2020-02-04 DIAGNOSIS — R509 Fever, unspecified: Secondary | ICD-10-CM

## 2020-02-04 LAB — EPSTEIN-BARR VIRUS VCA ANTIBODY PANEL
EBV NA IgG: 106 U/mL — ABNORMAL HIGH
EBV VCA IgG: 411 U/mL — ABNORMAL HIGH
EBV VCA IgM: <36 U/mL

## 2020-02-04 LAB — RHEUMATOID FACTOR: Rheumatoid fact SerPl-aCnc: <14 IU/mL (ref <14)

## 2020-02-04 LAB — ANA: Anti Nuclear Antibody (ANA): POSITIVE — AB

## 2020-02-04 LAB — T4: T4, Total: 7.2 ug/dL (ref 5.1–11.9)

## 2020-02-04 LAB — ANTISTREPTOLYSIN O TITER: ASO: <50 IU/mL (ref <200)

## 2020-02-04 LAB — ANTI-NUCLEAR AB-TITER (ANA TITER): ANA Titer 1: 1:80 {titer} — ABNORMAL HIGH

## 2020-02-04 MED ORDER — PREDNISONE 10 MG (21) PO TBPK: ORAL_TABLET | ORAL | 0 refills | Status: DC

## 2020-02-04 NOTE — Telephone Encounter (Signed)
Rx prednisone sent to pt pharmacy. 

## 2020-02-04 NOTE — Telephone Encounter (Signed)
Recent left upper quadrant tenderness since last visit and persisting fever. Can she drop by office today to give urine sample so we can get culture. I will give antibiotic for her to start later today but want to have culture sent out before she starts antibiotic.

## 2020-02-04 NOTE — Telephone Encounter (Signed)
  Referral to rheumatologist placed. 

## 2020-02-05 ENCOUNTER — Other Ambulatory Visit: Payer: Self-pay

## 2020-02-05 ENCOUNTER — Ambulatory Visit: Payer: BC Managed Care – PPO | Admitting: Medical

## 2020-02-05 VITALS — BP 115/58 | HR 88 | Temp 98.8°F | Resp 18 | Ht 68.0 in | Wt 230.8 lb

## 2020-02-05 DIAGNOSIS — R109 Unspecified abdominal pain: Secondary | ICD-10-CM | POA: Diagnosis not present

## 2020-02-05 DIAGNOSIS — R5383 Other fatigue: Secondary | ICD-10-CM

## 2020-02-05 DIAGNOSIS — R509 Fever, unspecified: Secondary | ICD-10-CM

## 2020-02-05 DIAGNOSIS — R768 Other specified abnormal immunological findings in serum: Secondary | ICD-10-CM | POA: Diagnosis not present

## 2020-02-05 DIAGNOSIS — M791 Myalgia, unspecified site: Secondary | ICD-10-CM

## 2020-02-05 LAB — POC URINALSYSI DIPSTICK (AUTOMATED)
Bilirubin, UA: NEGATIVE
Blood, UA: NEGATIVE
Glucose, UA: NEGATIVE
Ketones, UA: NEGATIVE
Leukocytes, UA: NEGATIVE
Nitrite, UA: NEGATIVE
Protein, UA: NEGATIVE
Spec Grav, UA: 1.01 (ref 1.010–1.025)
Urobilinogen, UA: 0.2 E.U./dL
pH, UA: 6.5 (ref 5.0–8.0)

## 2020-02-05 LAB — VITAMIN B12: Vitamin B-12: 235 pg/mL (ref 211–911)

## 2020-02-05 MED ORDER — FAMOTIDINE 20 MG PO TABS: 20.0000 mg | ORAL_TABLET | Freq: Every day | ORAL | 3 refills | Status: DC

## 2020-02-05 NOTE — Patient Instructions (Signed)
Your level of fever seems to be tapering off presently but you still have diffuse myalgias.  Your ANA was positive and your titer was elevated.  So I do recommend that you see a new rheumatologist and went ahead and made that referral.  During the interim I want you to start 6-day tapered dose of prednisone.  That prescription was sent to your pharmacy.  For persisting fatigue, this might be related to the ANA being positive.  But we will also go ahead and add B1, B12 and vitamin D level to your work-up.  You do have some intermittent abdomen pain.  Left upper quadrant region pain has resolved but now reporting some mild right upper quadrant pain.  Some degree of fatty liver reported on last ultrasound but no gallstones.  Will prescribe famotidine and recommend bland diet.  If your abdomen pain worsens then let me know.  In that event might prescribe Cipro and Flagyl combination but not convinced that you have presentation such as diverticulitis.  Will go ahead and get a UA in the office and send out urine culture.  Follow-up in 7 days or as needed.  Please keep me updated on any changing or worsening signs and symptoms.  Particularly abdomen pain.  If any extreme abdominal pain then recommend ED evaluation for further imaging.  Based on exam presently do not think imaging such as CT is indicated.  If diarrhea persist might also refer to GI MD.

## 2020-02-05 NOTE — Progress Notes (Signed)
Subjective:    Patient ID: Erica Aguirre, female    DOB: Jul 02, 1996, 24 y.o.   MRN: [id number]  HPI  Pt in for follow up.  Pt has been having fever and work has been started for FUO.   Yesterday afternoon temp was 99.6 in afternoon then at night 99.9. She has no tylenol or ibuprofen recently.  Pt states her bodyaches and soreness has decreased.  Pt had + ANA test and elevated titers. She has not started the prednisone taper dose yet. She has been fatigued over past 2-3 weeks. After dinner she had bodyaches and pains all night.  Pt left upper abdomen pain is now non tender. US abdomen showed no splenomegaly. She does have some degree of fatty liver. Some recent rt upper quadrant pain. Gallbladder shows not stones.  Pt told [facility] test was negative at [facility] last week. Pt send out covid test came back negative. Pt states along with fever and body aches she feel little foggy in head/processing thinking slower. But no gross motor or sensory function deficits.    Lmp- last week in march.      Review of Systems  Constitutional: Positive for fatigue and fever. Negative for chills.       See hpi.  Respiratory: Negative for choking, chest tightness, shortness of breath and wheezing.   Cardiovascular: Negative for chest pain and palpitations.  Gastrointestinal: Positive for abdominal pain. Negative for constipation, nausea and vomiting.       Patient states 3-4/10 pain rt upper quadrant. . No loose stools. No constipation.   Genitourinary: Negative for difficulty urinating, dysuria, flank pain, frequency and urgency.  Musculoskeletal: Negative for back pain.  Skin: Negative for rash.  Neurological: Positive for light-headedness. Negative for dizziness, seizures, syncope, facial asymmetry, speech difficulty, weakness and numbness.       See hpi.  Hematological: Negative for adenopathy. Does not bruise/bleed easily.  Psychiatric/Behavioral: Negative for decreased concentration.    Past  Medical History:  Diagnosis Date  . Hypertension    h/o-no medication needed  . Migraines    will occ have aura-     Social History   Socioeconomic History  . Marital status: Single    Spouse name: Not on file  . Number of children: Not on file  . Years of education: Not on file  . Highest education level: Not on file  Occupational History  . Not on file  Tobacco Use  . Smoking status: Never Smoker  . Smokeless tobacco: Never Used  Substance and Sexual Activity  . Alcohol use: No    Alcohol/week: 0.0 standard drinks  . Drug use: No  . Sexual activity: Not Currently    Partners: Male    Birth control/protection: I.U.D.  Other Topics Concern  . Not on file  Social History Narrative  . Not on file   Social Determinants of Health   Financial Resource Strain:   . Difficulty of Paying Living Expenses:   Food Insecurity:   . Worried About Charity fundraiser in the Last Year:   . Arboriculturist in the Last Year:   Transportation Needs:   . Film/video editor (Medical):   Marland Kitchen Lack of Transportation (Non-Medical):   Physical Activity:   . Days of Exercise per Week:   . Minutes of Exercise per Session:   Stress:   . Feeling of Stress :   Social Connections:   . Frequency of Communication with Friends and Family:   .  Frequency of Social Gatherings with Friends and Family:   . Attends Religious Services:   . Active Member of Clubs or Organizations:   . Attends Banker Meetings:   Marland Kitchen Marital Status:   Intimate Partner Violence:   . Fear of Current or Ex-Partner:   . Emotionally Abused:   Marland Kitchen Physically Abused:   . Sexually Abused:     Past Surgical History:  Procedure Laterality Date  . BACK SURGERY  12/01/2015    L3-L5 fusion done at [facility]   . WISDOM TOOTH EXTRACTION      Family History  Problem Relation Age of Onset  . Breast cancer Other        maternal cousins  . Pancreatic cancer Other        maternal side  . Ovarian cancer Other          maternal side  . Hypertension Mother        and maternal side  . Diabetes Mother        ? if diag or pre-diabetes  . Gestational diabetes Mother   . Migraines Mother        as teenager  . Hypertension Father        and paternal side  . Migraines Father   . Diabetes Maternal Grandmother   . Stroke Maternal Grandfather   . Stroke Paternal Grandmother   . Asthma Brother        sports induced when younger    Allergies  Allergen Reactions  . Metronidazole     rash rash   . Cefprozil Rash    rash  . Contrast Media [Iodinated Diagnostic Agents] Rash  . Other Rash    [id number]    Current Outpatient Medications on File Prior to Visit  Medication Sig Dispense Refill  . acetaminophen (TYLENOL) 325 MG tablet Take by mouth as needed.     . baclofen (LIORESAL) 20 MG tablet     . Cholecalciferol (VITAMIN D3) 10 MCG (400 UNIT) CAPS Take by mouth.    . gabapentin (NEURONTIN) 300 MG capsule     . ibuprofen (ADVIL,MOTRIN) 200 MG tablet Take by mouth.    . lamoTRIgine (LAMICTAL) 200 MG tablet Take 1 tablet by mouth daily.    . Magnesium (V-R MAGNESIUM) 250 MG TABS Take by mouth.    . magnesium gluconate (MAGONATE) 500 MG tablet Take 500 mg by mouth daily.    . Multiple Vitamin (MULTIVITAMIN) tablet Take 1 tablet by mouth daily.    . norethindrone (MICRONOR) 0.35 MG tablet Take 1 tablet (0.35 mg total) by mouth daily. 3 Package 3  . predniSONE (STERAPRED UNI-PAK 21 TAB) 10 MG (21) TBPK tablet Taper over 6 days. 21 tablet 0   No current facility-administered medications on file prior to visit.    BP (!) 115/58 (BP Location: Left Arm, Patient Position: Sitting, Cuff Size: Large)   Pulse 88   Temp 98.8 F (37.1 C) (Oral)   Resp 18   Ht 5\' 8"  (1.727 m)   Wt 230 lb 12.8 oz (104.7 kg)   LMP 01/15/2020 (Approximate)   SpO2 100%   BMI 35.09 kg/m       Objective:   Physical Exam   General Mental Status- Alert. General Appearance- Not in acute distress.    Skin General: Color- Normal Color. Moisture- Normal Moisture.  Neck Carotid Arteries- Normal color. Moisture- Normal Moisture. No carotid bruits. No JVD.  Chest and Lung Exam Auscultation: Breath Sounds:-Normal.  Cardiovascular  Auscultation:Rythm- Regular. Murmurs & Other Heart Sounds:Auscultation of the heart reveals- No Murmurs.  Abdomen Inspection:-Inspeection Normal. Palpation/Percussion:Note:No mass. Palpation and Percussion of the abdomen reveal- Non Tender, Non Distended + BS, no rebound or guarding.    Neurologic Cranial Nerve exam:- CN III-XII intact(No nystagmus), symmetric smile. Drift Test:- No drift. Romberg Exam:- Negative.  Heal to Toe Gait exam:-Normal. Finger to Nose:- Normal/Intact Strength:- 5/5 equal and symmetric strength both upper and lower extremities.     Assessment & Plan:  Your level of fever seems to be tapering off presently but you still have diffuse myalgias.  Your ANA was positive and your titer was elevated.  So I do recommend that you see a new rheumatologist and went ahead and made that referral.  During the interim I want you to start 6-day tapered dose of prednisone.  That prescription was sent to your pharmacy.  For persisting fatigue, this might be related to the ANA being positive.  But we will also go ahead and add B1, B12 and vitamin D level to your work-up.  You do have some intermittent abdomen pain.  Left upper quadrant region pain has resolved but now reporting some mild right upper quadrant pain.  Some degree of fatty liver reported on last ultrasound but no gallstones.  Will prescribe famotidine and recommend bland diet.  If your abdomen pain worsens then let me know.  In that event might prescribe Cipro and Flagyl combination but not convinced that you have presentation such as diverticulitis.  Will go ahead and get a UA in the office and send out urine culture.  Follow-up in 7 days or as needed.  Please keep me updated on any  changing or worsening signs and symptoms.  Particularly abdomen pain.  If any extreme abdominal pain then recommend ED evaluation for further imaging.  Based on exam presently do not think imaging such as CT is indicated.  If diarrhea persist might also refer to GI MD.  Time spent with patient today was  35 minutes which consisted of chart review, discussing  differentia diagnosis, work up/recent lab results  treatment and documentation.  Not she had completely normal neurologic was alert and oriented. Will follow subjective slow processing complain. Follow b12, b1 and vit D.

## 2020-02-06 LAB — URINE CULTURE
MICRO NUMBER:: 10384127
Result:: NO GROWTH
SPECIMEN QUALITY:: ADEQUATE

## 2020-02-07 ENCOUNTER — Telehealth: Payer: Self-pay | Admitting: Medical

## 2020-02-07 ENCOUNTER — Encounter: Payer: Self-pay | Admitting: Medical

## 2020-02-07 NOTE — Telephone Encounter (Signed)
Would you mind following up on this pt referral to rheumatolgist. +ana, elevated titer and persisting body aches. I put referral in and pt notified me symptoms persist and no call from specialist.  Since pt worsening quick referral please if possible.

## 2020-02-08 ENCOUNTER — Encounter: Payer: Self-pay | Admitting: Medical

## 2020-02-08 LAB — VITAMIN D 1,25 DIHYDROXY
Vitamin D 1, 25 (OH)2 Total: 42 pg/mL (ref 18–72)
Vitamin D2 1, 25 (OH)2: <8 pg/mL
Vitamin D3 1, 25 (OH)2: 42 pg/mL

## 2020-02-09 LAB — VITAMIN B1: Vitamin B1 (Thiamine): 15 nmol/L (ref 8–30)

## 2020-02-10 ENCOUNTER — Telehealth: Payer: Self-pay | Admitting: Medical

## 2020-02-10 NOTE — Telephone Encounter (Signed)
Pt is still having body aches and only little improved with prednisone. She had + ANA and elevted titers. She has appointment in 3 months. Can she bee seen sooner or can we try another office to get her seen within couple of weeks. 3 month very long time.

## 2020-02-11 ENCOUNTER — Telehealth: Payer: Self-pay | Admitting: Medical

## 2020-02-11 DIAGNOSIS — R5383 Other fatigue: Secondary | ICD-10-CM

## 2020-02-11 DIAGNOSIS — M791 Myalgia, unspecified site: Secondary | ICD-10-CM

## 2020-02-11 DIAGNOSIS — R109 Unspecified abdominal pain: Secondary | ICD-10-CM

## 2020-02-11 DIAGNOSIS — R768 Other specified abnormal immunological findings in serum: Secondary | ICD-10-CM

## 2020-02-11 NOTE — Telephone Encounter (Signed)
New referral to rheumatologist placed.

## 2020-02-11 NOTE — Telephone Encounter (Signed)
Referral to GI placed

## 2020-02-11 NOTE — Telephone Encounter (Signed)
Pt is scheduled for appt 7/29 with Dr. Corliss Skains.

## 2020-02-11 NOTE — Telephone Encounter (Addendum)
Since pt is already scheduled with Dr. Corliss Skains I would need a new referral to send to alternate providers. Recently [facility] has sooner availability if they accept the pt.   Please enter a new referral. TY  Ok. New referral placed. Hopefully can see in couple of weeks/sooner if possible?  Thanks,

## 2020-02-11 NOTE — Telephone Encounter (Signed)
Uploaded to [facility] for [facility]

## 2020-02-11 NOTE — Telephone Encounter (Signed)
Pt told me it was 3 months out. But she is expressing body aches and joint pain and she was hoping/asking someone else can see her faster?

## 2020-02-12 ENCOUNTER — Ambulatory Visit: Payer: BC Managed Care – PPO | Admitting: Gastroenterology

## 2020-02-12 ENCOUNTER — Other Ambulatory Visit: Payer: Self-pay

## 2020-02-12 ENCOUNTER — Telehealth: Payer: Self-pay | Admitting: Medical

## 2020-02-12 ENCOUNTER — Encounter: Payer: Self-pay | Admitting: Medical

## 2020-02-12 ENCOUNTER — Encounter: Payer: Self-pay | Admitting: Gastroenterology

## 2020-02-12 VITALS — BP 122/76 | HR 78 | Temp 98.2°F | Ht 68.0 in | Wt 232.2 lb

## 2020-02-12 DIAGNOSIS — R1011 Right upper quadrant pain: Secondary | ICD-10-CM | POA: Diagnosis not present

## 2020-02-12 DIAGNOSIS — G8929 Other chronic pain: Secondary | ICD-10-CM

## 2020-02-12 MED ORDER — PREDNISONE 10 MG PO TABS: 10.0000 mg | ORAL_TABLET | Freq: Every day | ORAL | 0 refills | Status: DC

## 2020-02-12 NOTE — Telephone Encounter (Signed)
Rx prednisone sent to pt pharmacy. 

## 2020-02-12 NOTE — Progress Notes (Addendum)
Chief Complaint:   Referring Provider:  Esperanza Richters, PA-C      ASSESSMENT AND PLAN;   #1.  LUQ abdo pain/RUQ abdo pain with positive Carnett's sign S/O musculoskeletal etiology with chest wall tenderness. Neg [place] 01/2020 except for fatty liver. Neg CT AP 01/2019. Nl CBC, CMP and lipase. Could represent fibromyalgia.  #2.  Fatty liver with normal LFTs.  Plan: - Heating pads at night. Biofreeze/icy hot in AM - Call us in 2 weeks. - If still with problems, EGD followed by HIDA with EF (She wants to hold off at this time) -I have discussed need for weight loss as treatment for fatty liver.  Her mother unfortunately had NASH cirrhosis.  She will also start exercising like walking and watching calorie intake.   HPI:    Erica Aguirre is a 24 y.o. female  Accompanied by her mom abd pain x 3 months - no change with eating, worse by touching and pressing.  This was initially on left upper quadrant and now it is on the right upper quadrant as well.  This is constant, also made worse by bending and walking for longer time.  Better with application of ice No nausea No vomiting No heartburn.  No jaundice dark urine or pale stools.  No fever chills or night sweats.  She denies having any significant diarrhea or constipation.  No melena or hematochezia.  No abdominal bloating.  No recent weight loss.  In fact she has gained weight recently.  No sodas, chocolates, chewing gums, artificial sweeteners and candy. No NSAIDs  She is being referred to rheumatology for possible autoimmune problems with borderline positive ANA and questionable fibromyalgia  Wants to hold off on EGD at the present time.  Mom cirrhosis- NASH with cirrhosis.  Past Medical History:  Diagnosis Date  . Hypertension    h/o-no medication needed  . Migraines    will occ have aura-  . Obesity     Past Surgical History:  Procedure Laterality Date  . BACK SURGERY  12/01/2015    L3-L5 fusion done at [facility]   .  WISDOM TOOTH EXTRACTION      Family History  Problem Relation Age of Onset  . Breast cancer Other        maternal cousins  . Pancreatic cancer Other        maternal side  . Ovarian cancer Other        maternal side  . Hypertension Mother        and maternal side  . Diabetes Mother        ? if diag or pre-diabetes  . Gestational diabetes Mother   . Migraines Mother        as teenager  . Colon polyps Mother   . Clotting disorder Mother   . Irritable bowel syndrome Mother   . Liver cancer Mother   . Hypertension Father        and paternal side  . Migraines Father   . Diabetes Maternal Grandmother   . Breast cancer Maternal Grandmother   . Stroke Maternal Grandfather   . Stroke Paternal Grandmother   . Asthma Brother        sports induced when younger  . Breast cancer Maternal Great-grandmother   . Ovarian cancer Maternal Great-grandmother   . Uterine cancer Maternal Great-grandmother   . Pancreatic cancer Cousin        on mother's side   . Uterine cancer Cousin   .  Colon cancer Neg Hx   . Esophageal cancer Neg Hx     Social History   Tobacco Use  . Smoking status: Never Smoker  . Smokeless tobacco: Never Used  Substance Use Topics  . Alcohol use: No    Alcohol/week: 0.0 standard drinks  . Drug use: No    Current Outpatient Medications  Medication Sig Dispense Refill  . acetaminophen (TYLENOL) 325 MG tablet Take by mouth as needed.     . baclofen (LIORESAL) 20 MG tablet Take 20 mg by mouth daily.     . Cholecalciferol (VITAMIN D3) 20 MCG (800 UNIT) TABS Take 1 tablet by mouth daily.    . famotidine (PEPCID) 20 MG tablet Take 1 tablet (20 mg total) by mouth daily. 30 tablet 3  . gabapentin (NEURONTIN) 300 MG capsule Take 600 mg by mouth daily.     Marland Kitchen ibuprofen (ADVIL,MOTRIN) 200 MG tablet Take 200 mg by mouth as needed.     . lamoTRIgine (LAMICTAL) 200 MG tablet Take 1 tablet by mouth daily.    . Magnesium (V-R MAGNESIUM) 250 MG TABS Take 2 tablets by mouth  daily.     . Multiple Vitamin (MULTIVITAMIN) tablet Take 1 tablet by mouth daily.    . norethindrone (MICRONOR) 0.35 MG tablet Take 1 tablet (0.35 mg total) by mouth daily. 3 Package 3  . predniSONE (DELTASONE) 10 MG tablet Take 1 tablet (10 mg total) by mouth daily with breakfast. (Patient not taking: Reported on 02/12/2020) 14 tablet 0   No current facility-administered medications for this visit.    Allergies  Allergen Reactions  . Metronidazole     rash rash   . Cefprozil Rash    rash  . Contrast Media [Iodinated Diagnostic Agents] Rash  . Other Rash    [id number]    Review of Systems:  Constitutional: Denies fever, chills, diaphoresis, appetite change and fatigue.  HEENT: Denies photophobia, eye pain, redness, hearing loss, ear pain, congestion, sore throat, rhinorrhea, sneezing, mouth sores, neck pain, neck stiffness and tinnitus.   Respiratory: Denies SOB, DOE, cough, chest tightness,  and wheezing.   Cardiovascular: Denies chest pain, palpitations and leg swelling.  Genitourinary: Denies dysuria, urgency, frequency, hematuria, flank pain and difficulty urinating.  Musculoskeletal: Has myalgias, back pain, joint swelling, arthralgias and gait problem.  Skin: No rash.  Neurological: Denies dizziness, seizures, syncope, weakness, light-headedness, numbness and headaches.  Hematological: Denies adenopathy. Easy bruising, personal or family bleeding history  Psychiatric/Behavioral: Has anxiety, depression     Physical Exam:    BP 122/76   Pulse 78   Temp 98.2 F (36.8 C)   Ht 5\' 8"  (1.727 m)   Wt 232 lb 4 oz (105.3 kg)   LMP 01/15/2020 (Approximate)   BMI 35.31 kg/m  Wt Readings from Last 3 Encounters:  02/12/20 232 lb 4 oz (105.3 kg)  02/05/20 230 lb 12.8 oz (104.7 kg)  02/01/20 229 lb 9.6 oz (104.1 kg)   Constitutional:  Well-developed, in no acute distress. Psychiatric: Normal mood and affect. Behavior is normal. HEENT: Pupils normal.   Conjunctivae are normal. No scleral icterus. Neck supple.  Cardiovascular: Normal rate, regular rhythm. No edema Pulmonary/chest: Effort normal and breath sounds normal. No wheezing, rales or rhonchi. Abdominal: Soft, nondistended.  Abdominal wall and rib tenderness-reproducible. Bowel sounds active throughout. There are no masses palpable. No hepatomegaly. Rectal:  defered Neurological: Alert and oriented to person place and time. Skin: Skin is warm and dry. No rashes noted.  Data Reviewed: I  have personally reviewed following labs and imaging studies  CBC: CBC Latest Ref Rng & Units 02/01/2020 01/23/2019 05/06/2016  WBC 4.0 - 10.5 K/uL 7.3 10.8(H) 9.2  Hemoglobin 12.0 - 15.0 g/dL 32.6 71.2 45.8  Hematocrit 36.0 - 46.0 % 41.8 43.5 42.2  Platelets 150.0 - 400.0 K/uL 225.0 296 286.0    CMP: CMP Latest Ref Rng & Units 02/01/2020 01/23/2019 05/06/2016  Glucose 70 - 99 mg/dL 84 89 91  BUN 6 - 23 mg/dL 16 9 12   Creatinine 0.40 - 1.20 mg/dL 0.99 8.33  Sodium 135 - 145 mEq/L 138 137 139  Potassium 3.5 - 5.1 mEq/L 4.3 3.5 4.5  Chloride 96 - 112 mEq/L 105 108 105  CO2 19 - 32 mEq/L 24 16(L) 24  Calcium 8.4 - 10.5 mg/dL 9.0 8.25) 9.7  Total Protein 6.0 - 8.3 g/dL 6.7 7.1 7.7  Total Bilirubin 0.2 - 1.2 mg/dL 0.4 0.4 0.4  Alkaline Phos 39 - 117 U/L 58 84 74  AST 0 - 37 U/L 20 19 17   ALT 0 - 35 U/L 32 28 20     Radiology Studies: 0.5(L Abdomen Complete  Result Date: 02/01/2020 CLINICAL DATA:  Abdominal pain, primarily left-sided EXAM: ABDOMEN ULTRASOUND COMPLETE COMPARISON:  CT abdomen and pelvis January 24, 2019 FINDINGS: Gallbladder: No gallstones or wall thickening visualized. There is no pericholecystic fluid. No sonographic Murphy sign noted by sonographer. Common bile duct: Diameter: 3 mm. No intrahepatic, common hepatic, or common bile duct dilatation. Liver: No focal lesion identified. Liver echogenicity overall is mildly increased. Portal vein is patent on color Doppler imaging with  normal direction of blood flow towards the liver. IVC: No abnormality visualized. Pancreas: No pancreatic mass or inflammatory focus. Spleen: Size and appearance within normal limits. Spleen measures 11.4 x 10.6 x 5.7 cm with a measured splenic volume of 363 mL. Right Kidney: Length: 11.4 cm. Echogenicity within normal limits. No mass or hydronephrosis visualized. Left Kidney: Length: 10.8 cm. Echogenicity within normal limits. No mass or hydronephrosis visualized. Abdominal aorta: No aneurysm visualized. Other findings: No demonstrable ascites. IMPRESSION: 1. Increased liver echogenicity, a finding indicative of a degree of hepatic steatosis. No focal liver lesions evident. 2.  Study otherwise unremarkable. Electronically Signed   By: 02/03/2020 III M.D.   On: 02/01/2020 11:03   Discussed with patient and patient's mom in detail.  Have also shown them CT scan films.   Bretta Bang, MD 02/12/2020, 3:33 PM  Cc: Edman Circle, PA-C

## 2020-02-12 NOTE — Patient Instructions (Signed)
If you are age 24 or older, your body mass index should be between 23-30. Your Body mass index is 35.31 kg/m. If this is out of the aforementioned range listed, please consider follow up with your Primary Care Provider.  If you are age 57 or younger, your body mass index should be between 19-25. Your Body mass index is 35.31 kg/m. If this is out of the aformentioned range listed, please consider follow up with your Primary Care Provider.   Heating pads at night and Biofreeze in the AM.   Please call Dr. Donne Hazel nurse in 2 weeks at [phone]  to let her now how you are doing.    Thank you,  Dr. Lynann Bologna

## 2020-04-04 ENCOUNTER — Ambulatory Visit: Payer: BC Managed Care – PPO | Admitting: Rheumatology

## 2020-04-04 ENCOUNTER — Encounter: Payer: Self-pay | Admitting: Rheumatology

## 2020-04-04 ENCOUNTER — Other Ambulatory Visit: Payer: Self-pay

## 2020-04-04 VITALS — BP 117/96 | HR 100 | Resp 13 | Ht 68.75 in | Wt 234.6 lb

## 2020-04-04 DIAGNOSIS — N938 Other specified abnormal uterine and vaginal bleeding: Secondary | ICD-10-CM

## 2020-04-04 DIAGNOSIS — M791 Myalgia, unspecified site: Secondary | ICD-10-CM | POA: Diagnosis not present

## 2020-04-04 DIAGNOSIS — R509 Fever, unspecified: Secondary | ICD-10-CM | POA: Diagnosis not present

## 2020-04-04 DIAGNOSIS — R768 Other specified abnormal immunological findings in serum: Secondary | ICD-10-CM

## 2020-04-04 DIAGNOSIS — G43119 Migraine with aura, intractable, without status migrainosus: Secondary | ICD-10-CM

## 2020-04-04 DIAGNOSIS — Z975 Presence of (intrauterine) contraceptive device: Secondary | ICD-10-CM

## 2020-04-04 DIAGNOSIS — G629 Polyneuropathy, unspecified: Secondary | ICD-10-CM

## 2020-04-04 DIAGNOSIS — R5383 Other fatigue: Secondary | ICD-10-CM

## 2020-04-04 DIAGNOSIS — G4709 Other insomnia: Secondary | ICD-10-CM

## 2020-04-04 NOTE — Progress Notes (Signed)
Office Visit Note  Patient: Erica Aguirre             Date of Birth: Sep 28, 1996           MRN: [id number]             PCP: Mackie Pai, PA-C Referring: Elise Benne Visit Date: 04/04/2020 Occupation: '@GUAROCC' @  Subjective:  New Patient (Initial Visit) (Abnormal labs)   History of Present Illness: Nelline Lio is a 24 y.o. female seen in consultation per request of her PCP for evaluation of positive ANA.  According to patient her symptoms a started about 3 months ago with fatigue and low-grade fever.  She states she rested for over 1 week resuming it was viral infection.  As her symptoms lingered she went to see her PCP.  At that time her labs came positive for ANA.  She was referred to [facility] rheumatology.  She was seen by a PA and had extensive lab lab work for lupus and Lyme per patient.  She states the lab work was negative.  She continues to have low-grade fever.  She states she has generalized body aches and hyperalgesia.  She has pain mostly localized around her arms shoulders and her hips.  She also experiences chest tightness.  She was seen by her PCP and was placed on some additional medications without much relief.  There is no history of autoimmune disease on the maternal side.  Activities of Daily Living:  Patient reports morning stiffness for 1 hour.   Patient Reports nocturnal pain.  Difficulty dressing/grooming: Denies Difficulty climbing stairs: Denies Difficulty getting out of chair: Denies Difficulty using hands for taps, buttons, cutlery, and/or writing: Denies  Review of Systems  Constitutional: Positive for fatigue and fever. Negative for night sweats, weight gain and weight loss.  HENT: Negative for mouth sores, trouble swallowing, trouble swallowing, mouth dryness and nose dryness.   Eyes: Negative for pain, redness, visual disturbance and dryness.  Respiratory: Negative for cough, shortness of breath and difficulty breathing.   Cardiovascular:  Negative for chest pain, palpitations, hypertension, irregular heartbeat and swelling in legs/feet.  Gastrointestinal: Negative for blood in stool, constipation and diarrhea.  Endocrine: Negative for increased urination.  Genitourinary: Negative for vaginal dryness.  Musculoskeletal: Positive for myalgias, morning stiffness, muscle tenderness and myalgias. Negative for arthralgias, joint pain, joint swelling and muscle weakness.  Skin: Positive for hair loss. Negative for color change, rash, skin tightness, ulcers and sensitivity to sunlight.  Allergic/Immunologic: Negative for susceptible to infections.  Neurological: Positive for numbness, headaches and memory loss. Negative for dizziness, night sweats and weakness.  Hematological: Negative for swollen glands.  Psychiatric/Behavioral: Positive for sleep disturbance. Negative for depressed mood. The patient is nervous/anxious.     PMFS History:  Patient Active Problem List   Diagnosis Date Noted   Viral gastroenteritis 12/25/2018   Upper respiratory tract infection 12/25/2018   IUD (intrauterine device) in place 10/15/2016   Status post lumbar spine operation 11/28/2015   Closed wedge fracture of lumbar vertebra ([place]) 10/24/2015   DUB (dysfunctional uterine bleeding) 05/22/2015   Migraine with aura 03/20/2015   Headache, migraine 10/21/2014    Past Medical History:  Diagnosis Date   Hypertension    h/o-no medication needed   Migraines    will occ have aura-   Obesity     Family History  Problem Relation Age of Onset   Hypertension Mother        and maternal side   Diabetes Mother        ?  if diag or pre-diabetes   Gestational diabetes Mother    Migraines Mother        as teenager   Colon polyps Mother    Clotting disorder Mother    Irritable bowel syndrome Mother    Liver cancer Mother    Hypertension Father        and paternal side   Migraines Father    Diabetes Maternal Grandmother    Breast  cancer Maternal Grandmother    Stroke Maternal Grandfather    Stroke Paternal Grandmother    Asthma Brother        sports induced when younger   Asperger's syndrome Brother    Breast cancer Maternal Great-grandmother    Ovarian cancer Maternal Great-grandmother    Uterine cancer Maternal Great-grandmother    Pancreatic cancer Cousin        on mother's side    Uterine cancer Cousin        on mother's side   Colon cancer Neg Hx    Esophageal cancer Neg Hx    Past Surgical History:  Procedure Laterality Date   BACK SURGERY  12/01/2015    L3-L5 fusion done at [place] History Narrative   Not on file   Immunization History  Administered Date(s) Administered   Tdap 06/27/2019     Objective: Vital Signs: BP (!) 117/96 (BP Location: Right Arm, Patient Position: Sitting, Cuff Size: Normal)    Pulse 100    Resp 13    Ht 5' 8.75" (1.746 m)    Wt 234 lb 9 oz (106.4 kg)    LMP 03/11/2020    BMI 34.89 kg/m    Physical Exam Vitals and nursing note reviewed.  Constitutional:      Appearance: She is well-developed.  HENT:     Head: Normocephalic and atraumatic.  Eyes:     Conjunctiva/sclera: Conjunctivae normal.  Cardiovascular:     Rate and Rhythm: Normal rate and regular rhythm.     Heart sounds: Normal heart sounds.  Pulmonary:     Effort: Pulmonary effort is normal.     Breath sounds: Normal breath sounds.  Abdominal:     General: Bowel sounds are normal.     Palpations: Abdomen is soft.  Musculoskeletal:     Cervical back: Normal range of motion.  Lymphadenopathy:     Cervical: No cervical adenopathy.  Skin:    General: Skin is warm and dry.     Capillary Refill: Capillary refill takes less than 2 seconds.  Neurological:     Mental Status: She is alert and oriented to person, place, and time.  Psychiatric:        Behavior: Behavior normal.      Musculoskeletal Exam: C-spine thoracic and  lumbar spine with good range of motion.  Shoulder joints, elbow joints, wrist joints, MCPs PIPs DIPs with good range of motion with no synovitis.  Hip joints, knee joints, ankles, MTPs PIPs DIPs with good range of motion.  She has generalized hyperalgesia and positive tender points.  CDAI Exam: CDAI Score: -- Patient Global: --; Provider Global: -- Swollen: --; Tender: -- Joint Exam 04/04/2020   No joint exam has been documented for this visit   There is currently no information documented on the homunculus. Go to the Rheumatology activity and complete the homunculus joint exam.  Investigation: No additional findings.  Imaging: No results found.  Recent Labs: Lab  Results  Component Value Date   WBC 7.3 02/01/2020   HGB 14.3 02/01/2020   PLT 225.0 02/01/2020   NA 138 02/01/2020   K 4.3 02/01/2020   CL 105 02/01/2020   CO2 24 02/01/2020   GLUCOSE 84 02/01/2020   BUN 16 02/01/2020   CREATININE 0.64 02/01/2020   BILITOT 0.4 02/01/2020   ALKPHOS 58 02/01/2020   AST 20 02/01/2020   ALT 32 02/01/2020   PROT 6.7 02/01/2020   ALBUMIN 4.4 02/01/2020   CALCIUM 9.0 02/01/2020   GFRAA >60 01/23/2019   February 01, 2020 ANA 1: 80NH, EBV past infection, ESR 20, CRP negative, RF negative, TSH normal, ASO titer negative  Patient showed me on her cell phone labs from [facility] rheumatology Feb 25, 2020 ANA 1: 160 nuclear homogenous, C3-C4 and CH 50 were normal, ENA negative, protein creatinine ratio was normal, parvo was negative, Lyme was negative  Speciality Comments: No specialty comments available.  Procedures:  No procedures performed Allergies: Metronidazole, Cefprozil, Contrast media [iodinated diagnostic agents], and Other   Assessment / Plan:     Visit Diagnoses: Positive ANA (antinuclear antibody)-patient has low titer ANA.  The ENA panel done at [facility] rheumatology was negative.  Complements were normal.  She has no clinical features of systemic lupus.  I have advised  her to contact me in case she develops any new symptoms.  Myalgia -she complains of ongoing muscle pain.  She has hyperalgesia.  She has positive tender points.  It raises concern of myofascial pain syndrome or fibromyalgia.  I will obtain some additional labs today.  Need for regular exercise and good sleep hygiene was discussed.  Plan: CK, VITAMIN D 25 Hydroxy (Vit-D Deficiency, Fractures), Serum protein electrophoresis with reflex  Other fatigue -she has ongoing fatigue.  Plan: CK, VITAMIN D 25 Hydroxy (Vit-D Deficiency, Fractures)  Low grade fever-patient states she has low-grade fever which is persistent.  I have advised her to discuss with her PCP about a referral to infectious disease.  On chart review I noted that EBV, parvo and Lyme test were negative.  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  Patient is on gabapentin.  Orders: Orders Placed This Encounter  Procedures   CK   VITAMIN D 25 Hydroxy (Vit-D Deficiency, Fractures)   Serum protein electrophoresis with reflex   No orders of the defined types were placed in this encounter.    Follow-Up Instructions: No follow-ups on file.   Bo Merino, MD  Note - This record has been created using Editor, commissioning.  Chart creation errors have been sought, but may not always  have been located. Such creation errors do not reflect on  the standard of medical care.

## 2020-04-07 ENCOUNTER — Encounter: Payer: Self-pay | Admitting: Rheumatology

## 2020-04-07 ENCOUNTER — Telehealth: Payer: Self-pay | Admitting: *Deleted

## 2020-04-07 DIAGNOSIS — E559 Vitamin D deficiency, unspecified: Secondary | ICD-10-CM

## 2020-04-07 LAB — CK: Total CK: 47 U/L (ref 29–143)

## 2020-04-07 LAB — PROTEIN ELECTROPHORESIS, SERUM, WITH REFLEX
Albumin ELP: 4.3 g/dL (ref 3.8–4.8)
Alpha 1: 0.3 g/dL (ref 0.2–0.3)
Alpha 2: 0.7 g/dL (ref 0.5–0.9)
Beta 2: 0.4 g/dL (ref 0.2–0.5)
Beta Globulin: 0.4 g/dL (ref 0.4–0.6)
Gamma Globulin: 0.8 g/dL (ref 0.8–1.7)
Total Protein: 7 g/dL (ref 6.1–8.1)

## 2020-04-07 LAB — VITAMIN D 25 HYDROXY (VIT D DEFICIENCY, FRACTURES): Vit D, 25-Hydroxy: 22 ng/mL — ABNORMAL LOW (ref 30–100)

## 2020-04-07 MED ORDER — VITAMIN D (ERGOCALCIFEROL) 1.25 MG (50000 UNIT) PO CAPS: 50000.0000 [IU] | ORAL_CAPSULE | ORAL | 0 refills | Status: DC

## 2020-04-07 NOTE — Telephone Encounter (Signed)
-----   Message from Pollyann Savoy, MD sent at 04/07/2020  8:50 AM EDT ----- Vitamin D is low.  Please call in vitamin D 50,000 units once a week for 3 months.  Repeat vitamin D level in 3 months.

## 2020-04-07 NOTE — Progress Notes (Signed)
CK and SPEP are normal.  All results discussed results at the follow-up visit.

## 2020-04-07 NOTE — Telephone Encounter (Signed)
Verified verbal prescription with Dr. Corliss Skains. vitamin D 50,000 units once a week for 3 months # 12 No refills.

## 2020-04-07 NOTE — Progress Notes (Signed)
Vitamin D is low.  Please call in vitamin D 50,000 units once a week for 3 months.  Repeat vitamin D level in 3 months.

## 2020-04-08 NOTE — Telephone Encounter (Signed)
Patient was notified that CK value was normal.

## 2020-04-20 NOTE — Progress Notes (Deleted)
Office Visit Note  Patient: Erica Aguirre             Date of Birth: May 24, 1996           MRN: [id number]             PCP: Esperanza Richters, PA-C Referring: Marisue Brooklyn Visit Date: 04/30/2020 Occupation: @GUAROCC @  Subjective:  No chief complaint on file.   History of Present Illness: Erica Aguirre is a 24 y.o. female ***   Activities of Daily Living:  Patient reports morning stiffness for *** {minute/hour:19697}.   Patient {ACTIONS;DENIES/REPORTS:21021675::"Denies"} nocturnal pain.  Difficulty dressing/grooming: {ACTIONS;DENIES/REPORTS:21021675::"Denies"} Difficulty climbing stairs: {ACTIONS;DENIES/REPORTS:21021675::"Denies"} Difficulty getting out of chair: {ACTIONS;DENIES/REPORTS:21021675::"Denies"} Difficulty using hands for taps, buttons, cutlery, and/or writing: {ACTIONS;DENIES/REPORTS:21021675::"Denies"}  No Rheumatology ROS completed.   PMFS History:  Patient Active Problem List   Diagnosis Date Noted  . Viral gastroenteritis 12/25/2018  . Upper respiratory tract infection 12/25/2018  . IUD (intrauterine device) in place 10/15/2016  . Status post lumbar spine operation 11/28/2015  . Closed wedge fracture of lumbar vertebra ([facility]) 10/24/2015  . DUB (dysfunctional uterine bleeding) 05/22/2015  . Migraine with aura 03/20/2015  . Headache, migraine 10/21/2014    Past Medical History:  Diagnosis Date  . Hypertension    h/o-no medication needed  . Migraines    will occ have aura-  . Obesity     Family History  Problem Relation Age of Onset  . Hypertension Mother        and maternal side  . Diabetes Mother        ? if diag or pre-diabetes  . Gestational diabetes Mother   . Migraines Mother        as teenager  . Colon polyps Mother   . Clotting disorder Mother   . Irritable bowel syndrome Mother   . Liver cancer Mother   . Hypertension Father        and paternal side  . Migraines Father   . Diabetes Maternal Grandmother   . Breast cancer  Maternal Grandmother   . Stroke Maternal Grandfather   . Stroke Paternal Grandmother   . Asthma Brother        sports induced when younger  . Asperger's syndrome Brother   . Breast cancer Maternal Great-grandmother   . Ovarian cancer Maternal Great-grandmother   . Uterine cancer Maternal Great-grandmother   . Pancreatic cancer Cousin        on mother's side   . Uterine cancer Cousin        on mother's side  . Colon cancer Neg Hx   . Esophageal cancer Neg Hx    Past Surgical History:  Procedure Laterality Date  . BACK SURGERY  12/01/2015    L3[facility]L5 fusion [facility] at [facility]   . WISDOM TOOTH EXTRACTION     Social History   Social History Narrative  . Not on file   Immunization History  Administered Date(s) Administered  . Tdap 06/27/2019     Objective: Vital Signs: There were no vitals taken for this visit.   Physical Exam   Musculoskeletal Exam: ***  CDAI Exam: CDAI Score: -- Patient Global: --; Provider Global: -- Swollen: --; Tender: -- Joint Exam 04/30/2020   No joint exam has been documented for this visit   There is currently no information documented on the homunculus. Go to the Rheumatology activity and complete the homunculus joint exam.  Investigation: No additional findings.  Imaging: No results found.  Recent Labs: Lab Results  Component Value Date   WBC 7.3 02/01/2020   HGB 14.3 02/01/2020   PLT 225.0 02/01/2020   NA 138 02/01/2020   K 4.3 02/01/2020   CL 105 02/01/2020   CO2 24 02/01/2020   GLUCOSE 84 02/01/2020   BUN 16 02/01/2020   CREATININE 0.64 02/01/2020   BILITOT 0.4 02/01/2020   ALKPHOS 58 02/01/2020   AST 20 02/01/2020   ALT 32 02/01/2020   PROT 7.0 04/04/2020   ALBUMIN 4.4 02/01/2020   CALCIUM 9.0 02/01/2020   GFRAA >60 01/23/2019  April 04, 2020 SPEP normal, CK 47, vitamin D 22  Speciality Comments: No specialty comments available.  Procedures:  No procedures performed Allergies: Metronidazole, Cefprozil, Contrast  media [iodinated diagnostic agents], and Other   Assessment / Plan:     Visit Diagnoses: No diagnosis found.  Orders: No orders of the defined types were placed in this encounter.  No orders of the defined types were placed in this encounter.   Face-to-face time spent with patient was *** minutes. Greater than 50% of time was spent in counseling and coordination of care.  Follow-Up Instructions: No follow-ups on file.   Pollyann Savoy, MD  Note - This record has been created using Animal nutritionist.  Chart creation errors have been sought, but may not always  have been located. Such creation errors do not reflect on  the standard of medical care.

## 2020-04-30 ENCOUNTER — Ambulatory Visit: Payer: BC Managed Care – PPO | Admitting: Rheumatology

## 2020-05-01 ENCOUNTER — Ambulatory Visit: Payer: BC Managed Care – PPO | Admitting: Rheumatology

## 2020-05-01 ENCOUNTER — Other Ambulatory Visit: Payer: Self-pay

## 2020-05-01 ENCOUNTER — Encounter: Payer: Self-pay | Admitting: Rheumatology

## 2020-05-01 VITALS — BP 115/74 | HR 85 | Resp 14 | Ht 68.0 in | Wt 246.8 lb

## 2020-05-01 DIAGNOSIS — G4709 Other insomnia: Secondary | ICD-10-CM

## 2020-05-01 DIAGNOSIS — N938 Other specified abnormal uterine and vaginal bleeding: Secondary | ICD-10-CM

## 2020-05-01 DIAGNOSIS — M7061 Trochanteric bursitis, right hip: Secondary | ICD-10-CM

## 2020-05-01 DIAGNOSIS — R768 Other specified abnormal immunological findings in serum: Secondary | ICD-10-CM | POA: Diagnosis not present

## 2020-05-01 DIAGNOSIS — R5383 Other fatigue: Secondary | ICD-10-CM

## 2020-05-01 DIAGNOSIS — M7918 Myalgia, other site: Secondary | ICD-10-CM | POA: Diagnosis not present

## 2020-05-01 DIAGNOSIS — G629 Polyneuropathy, unspecified: Secondary | ICD-10-CM

## 2020-05-01 DIAGNOSIS — Z975 Presence of (intrauterine) contraceptive device: Secondary | ICD-10-CM

## 2020-05-01 DIAGNOSIS — E559 Vitamin D deficiency, unspecified: Secondary | ICD-10-CM | POA: Diagnosis not present

## 2020-05-01 DIAGNOSIS — M7062 Trochanteric bursitis, left hip: Secondary | ICD-10-CM

## 2020-05-01 DIAGNOSIS — G43119 Migraine with aura, intractable, without status migrainosus: Secondary | ICD-10-CM

## 2020-05-01 NOTE — Progress Notes (Signed)
Office Visit Note  Patient: Erica Aguirre             Date of Birth: Feb 27, 1996           MRN: [id number]             PCP: Mackie Pai, PA-C Referring: Elise Benne Visit Date: 05/01/2020 Occupation: _0 @  Subjective:  Fatigue and pain   History of Present Illness: Willisha Sligar is a 24 y.o. female with history of myalgias and arthralgias.  She states she continues to have generalized pain and discomfort in her joints and muscles.  She also continues to have fatigue and insomnia.  She denies any joint swelling.  She is started taking vitamin D 50,000 units a day.  She has not noticed any improvement so far.  There is no history of oral ulcers, nasal ulcers, malar rash, photosensitivity, Raynaud's phenomenon or sicca symptoms.  Activities of Daily Living:  Patient reports morning stiffness for 45-60  minutes.   Patient Reports nocturnal pain.  Difficulty dressing/grooming: Denies Difficulty climbing stairs: Denies Difficulty getting out of chair: Reports Difficulty using hands for taps, buttons, cutlery, and/or writing: Denies  Review of Systems  Constitutional: Positive for fatigue. Negative for night sweats, weight gain and weight loss.  HENT: Negative for mouth sores, trouble swallowing, trouble swallowing, mouth dryness and nose dryness.   Eyes: Negative for pain, redness, itching, visual disturbance and dryness.  Respiratory: Negative for cough, shortness of breath, wheezing and difficulty breathing.   Cardiovascular: Negative for chest pain, palpitations, hypertension, irregular heartbeat and swelling in legs/feet.  Gastrointestinal: Negative for blood in stool, constipation and diarrhea.  Endocrine: Negative for increased urination.  Genitourinary: Negative for difficulty urinating and vaginal dryness.  Musculoskeletal: Positive for arthralgias, joint pain and morning stiffness. Negative for joint swelling, myalgias, muscle weakness, muscle tenderness and  myalgias.  Skin: Positive for hair loss. Negative for color change, rash, redness, skin tightness, ulcers and sensitivity to sunlight.  Allergic/Immunologic: Negative for susceptible to infections.  Neurological: Positive for numbness, headaches and weakness. Negative for dizziness, light-headedness, memory loss and night sweats.  Hematological: Positive for bruising/bleeding tendency. Negative for swollen glands.  Psychiatric/Behavioral: Positive for sleep disturbance. Negative for depressed mood and confusion. The patient is nervous/anxious.     PMFS History:  Patient Active Problem List   Diagnosis Date Noted  . Viral gastroenteritis 12/25/2018  . Upper respiratory tract infection 12/25/2018  . IUD (intrauterine device) in place 10/15/2016  . Status post lumbar spine operation 11/28/2015  . Closed wedge fracture of lumbar vertebra ([place]) 10/24/2015  . DUB (dysfunctional uterine bleeding) 05/22/2015  . Migraine with aura 03/20/2015  . Headache, migraine 10/21/2014    Past Medical History:  Diagnosis Date  . Hypertension    h/o-no medication needed  . Migraines    will occ have aura-  . Obesity     Family History  Problem Relation Age of Onset  . Hypertension Mother        and maternal side  . Diabetes Mother        ? if diag or pre-diabetes  . Gestational diabetes Mother   . Migraines Mother        as teenager  . Colon polyps Mother   . Clotting disorder Mother   . Irritable bowel syndrome Mother   . Liver cancer Mother   . Hypertension Father        and paternal side  . Migraines Father   . Diabetes Maternal Grandmother   .  Breast cancer Maternal Grandmother   . Stroke Maternal Grandfather   . Stroke Paternal Grandmother   . Asthma Brother        sports induced when younger  . Asperger's syndrome Brother   . Breast cancer Maternal Great-grandmother   . Ovarian cancer Maternal Great-grandmother   . Uterine cancer Maternal Great-grandmother   . Pancreatic cancer  Cousin        on mother's side   . Uterine cancer Cousin        on mother's side  . Colon cancer Neg Hx   . Esophageal cancer Neg Hx    Past Surgical History:  Procedure Laterality Date  . BACK SURGERY  12/01/2015    L3-L5 fusion done at [facility]   . WISDOM TOOTH EXTRACTION     Social History   Social History Narrative  . Not on file   Immunization History  Administered Date(s) Administered  . Tdap 06/27/2019     Objective: Vital Signs: BP 115/74 (BP Location: Left Arm, Patient Position: Sitting, Cuff Size: Normal)   Pulse 85   Resp 14   Ht _0  (1.727 m)   Wt 246 lb 12.8 oz (111.9 kg)   BMI 37.53 kg/m    Physical Exam Vitals and nursing note reviewed.  Constitutional:      Appearance: She is well-developed.  HENT:     Head: Normocephalic and atraumatic.  Eyes:     Conjunctiva/sclera: Conjunctivae normal.  Cardiovascular:     Rate and Rhythm: Normal rate and regular rhythm.     Heart sounds: Normal heart sounds.  Pulmonary:     Effort: Pulmonary effort is normal.     Breath sounds: Normal breath sounds.  Abdominal:     General: Bowel sounds are normal.     Palpations: Abdomen is soft.  Musculoskeletal:     Cervical back: Normal range of motion.  Lymphadenopathy:     Cervical: No cervical adenopathy.  Skin:    General: Skin is warm and dry.     Capillary Refill: Capillary refill takes less than 2 seconds.  Neurological:     Mental Status: She is alert and oriented to person, place, and time.  Psychiatric:        Behavior: Behavior normal.      Musculoskeletal Exam: C-spine thoracic and lumbar spine with good range of motion.  Shoulder joints, elbow joints, wrist joints, MCPs PIPs and DIPs with good range of motion with no synovitis.  Hip joints, knee joints, ankles, MTPs and PIPs with good range of motion with no synovitis.  She had tenderness on palpation of bilateral trapezius region, bilateral lateral epicondyle, gluteal, trochanteric area and   medial aspect of her knees.  She also had hyperalgesia.  CDAI Exam: CDAI Score: -- Patient Global: --; Provider Global: -- Swollen: --; Tender: -- Joint Exam 05/01/2020   No joint exam has been documented for this visit   There is currently no information documented on the homunculus. Go to the Rheumatology activity and complete the homunculus joint exam.  Investigation: No additional findings.  Imaging: No results found.  Recent Labs: Lab Results  Component Value Date   WBC 7.3 02/01/2020   HGB 14.3 02/01/2020   PLT 225.0 02/01/2020   NA 138 02/01/2020   K 4.3 02/01/2020   CL 105 02/01/2020   CO2 24 02/01/2020   GLUCOSE 84 02/01/2020   BUN 16 02/01/2020   CREATININE 0.64 02/01/2020   BILITOT 0.4 02/01/2020   ALKPHOS  58 02/01/2020   AST 20 02/01/2020   ALT 32 02/01/2020   PROT 7.0 04/04/2020   ALBUMIN 4.4 02/01/2020   CALCIUM 9.0 02/01/2020   GFRAA >60 01/23/2019  April 04, 2020 SPEP normal, CK 47, vitamin D 22  February 01, 2020 ANA 1: 80NH, EBV past infection, ESR 20, CRP negative, RF negative, TSH normal, ASO titer negative  Patient showed me on her cell phone labs from [facility] rheumatology Feb 25, 2020 ANA 1: 160 nuclear homogenous, C3-C4 and CH 50 were normal, ENA negative, protein creatinine ratio was normal, parvo was negative, Lyme was negative  Speciality Comments: No specialty comments available.  Procedures:  No procedures performed Allergies: Metronidazole, Cefprozil, Contrast media [iodinated diagnostic agents], and Other   Assessment / Plan:     Visit Diagnoses: Positive ANA (antinuclear antibody)-patient had low titer positive ANA in the past.  Her ENA and C3-C4 were normal.  She has no clinical features of autoimmune disease.  I have advised her to contact me in case she develops any new symptoms.  Vitamin D deficiency -she has vitamin D deficiency.  She is on vitamin D 50,000 units/week.  I have advised her to get vitamin D level in 3 months.   She would prefer to do it with her PCP.  I have advised her if her vitamin D is in normal range then she should take 2000 units of vitamin D daily to maintain the levels.  Trochanteric bursitis of both hips-she had tenderness on palpation of bilateral trochanteric bursa.  IT band exercises were demonstrated in the office today handout was given.  Myofascial pain -she has generalized pain, positive tender points, fatigue, hyperalgesia.  Detail tonsillary myofascial pain was provided.  Need for regular exercise was emphasized.  Good sleep hygiene was discussed.  I have referred her to integrative therapies.  She would benefit from water aerobics.  Plan: Ambulatory referral to Physical Therapy  Other fatigue-probably related to myofascial pain and insomnia.  Recent labs including CK and SPEP were discussed.  Other insomnia-good sleep hygiene was discussed.  Intractable migraine with aura without status migrainosus  DUB (dysfunctional uterine bleeding)  IUD (intrauterine device) in place  Neuropathy  Orders: Orders Placed This Encounter  Procedures  . Ambulatory referral to Physical Therapy   No orders of the defined types were placed in this encounter.     Follow-Up Instructions: Return in about 1 year (around 05/01/2021) for MFPS, +ANA.   Bo Merino, MD  Note - This record has been created using Editor, commissioning.  Chart creation errors have been sought, but may not always  have been located. Such creation errors do not reflect on  the standard of medical care.

## 2020-05-01 NOTE — Patient Instructions (Signed)
Cervical Strain and Sprain Rehab Ask your health care provider which exercises are safe for you. Do exercises exactly as told by your health care provider and adjust them as directed. It is normal to feel mild stretching, pulling, tightness, or discomfort as you do these exercises. Stop right away if you feel sudden pain or your pain gets worse. Do not begin these exercises until told by your health care provider. Stretching and range-of-motion exercises Cervical side bending  1. Using good posture, sit on a stable chair or stand up. 2. Without moving your shoulders, slowly tilt your left / right ear to your shoulder until you feel a stretch in the opposite side neck muscles. You should be looking straight ahead. 3. Hold for __________ seconds. 4. Repeat with the other side of your neck. Repeat __________ times. Complete this exercise __________ times a day. Cervical rotation  1. Using good posture, sit on a stable chair or stand up. 2. Slowly turn your head to the side as if you are looking over your left / right shoulder. ? Keep your eyes level with the ground. ? Stop when you feel a stretch along the side and the back of your neck. 3. Hold for __________ seconds. 4. Repeat this by turning to your other side. Repeat __________ times. Complete this exercise __________ times a day. Thoracic extension and pectoral stretch 1. Roll a towel or a small blanket so it is about 4 inches (10 cm) in diameter. 2. Lie down on your back on a firm surface. 3. Put the towel lengthwise, under your spine in the middle of your back. It should not be under your shoulder blades. The towel should line up with your spine from your middle back to your lower back. 4. Put your hands behind your head and let your elbows fall out to your sides. 5. Hold for __________ seconds. Repeat __________ times. Complete this exercise __________ times a day. Strengthening exercises Isometric upper cervical flexion 1. Lie on  your back with a thin pillow behind your head and a small rolled-up towel under your neck. 2. Gently tuck your chin toward your chest and nod your head down to look toward your feet. Do not lift your head off the pillow. 3. Hold for __________ seconds. 4. Release the tension slowly. Relax your neck muscles completely before you repeat this exercise. Repeat __________ times. Complete this exercise __________ times a day. Isometric cervical extension  1. Stand about 6 inches (15 cm) away from a wall, with your back facing the wall. 2. Place a soft object, about 6-8 inches (15-20 cm) in diameter, between the back of your head and the wall. A soft object could be a small pillow, a ball, or a folded towel. 3. Gently tilt your head back and press into the soft object. Keep your jaw and forehead relaxed. 4. Hold for __________ seconds. 5. Release the tension slowly. Relax your neck muscles completely before you repeat this exercise. Repeat __________ times. Complete this exercise __________ times a day. Posture and body mechanics Body mechanics refers to the movements and positions of your body while you do your daily activities. Posture is part of body mechanics. Good posture and healthy body mechanics can help to relieve stress in your body's tissues and joints. Good posture means that your spine is in its natural S-curve position (your spine is neutral), your shoulders are pulled back slightly, and your head is not tipped forward. The following are general guidelines for applying improved   posture and body mechanics to your everyday activities. Sitting  1. When sitting, keep your spine neutral and keep your feet flat on the floor. Use a footrest, if necessary, and keep your thighs parallel to the floor. Avoid rounding your shoulders, and avoid tilting your head forward. 2. When working at a desk or a computer, keep your desk at a height where your hands are slightly lower than your elbows. Slide your  chair under your desk so you are close enough to maintain good posture. 3. When working at a computer, place your monitor at a height where you are looking straight ahead and you do not have to tilt your head forward or downward to look at the screen. Standing   When standing, keep your spine neutral and keep your feet about hip-width apart. Keep a slight bend in your knees. Your ears, shoulders, and hips should line up.  When you do a task in which you stand in one place for a long time, place one foot up on a stable object that is 2-4 inches (5-10 cm) high, such as a footstool. This helps keep your spine neutral. Resting When lying down and resting, avoid positions that are most painful for you. Try to support your neck in a neutral position. You can use a contour pillow or a small rolled-up towel. Your pillow should support your neck but not push on it. This information is not intended to replace advice given to you by your health care provider. Make sure you discuss any questions you have with your health care provider. Document Revised: 01/24/2019 Document Reviewed: 07/05/2018 Elsevier Patient Education  Gregory Band Syndrome Rehab Ask your health care provider which exercises are safe for you. Do exercises exactly as told by your health care provider and adjust them as directed. It is normal to feel mild stretching, pulling, tightness, or discomfort as you do these exercises. Stop right away if you feel sudden pain or your pain gets significantly worse. Do not begin these exercises until told by your health care provider. Stretching and range-of-motion exercises These exercises warm up your muscles and joints and improve the movement and flexibility of your hip and pelvis. Quadriceps stretch, prone  5. Lie on your abdomen on a firm surface, such as a bed or padded floor (prone position). 6. Bend your left / right knee and reach back to hold your ankle or pant leg. If  you cannot reach your ankle or pant leg, loop a belt around your foot and grab the belt instead. 7. Gently pull your heel toward your buttocks. Your knee should not slide out to the side. You should feel a stretch in the front of your thigh and knee (quadriceps). 8. Hold this position for __________ seconds. Repeat __________ times. Complete this exercise __________ times a day. Iliotibial band stretch An iliotibial band is a strong band of muscle tissue that runs from the outer side of your hip to the outer side of your thigh and knee. 5. Lie on your side with your left / right leg in the top position. 6. Bend both of your knees and grab your left / right ankle. Stretch out your bottom arm to help you balance. 7. Slowly bring your top knee back so your thigh goes behind your trunk. 8. Slowly lower your top leg toward the floor until you feel a gentle stretch on the outside of your left / right hip and thigh. If you do not feel a  stretch and your knee will not fall farther, place the heel of your other foot on top of your knee and pull your knee down toward the floor with your foot. 9. Hold this position for __________ seconds. Repeat __________ times. Complete this exercise __________ times a day. Strengthening exercises These exercises build strength and endurance in your hip and pelvis. Endurance is the ability to use your muscles for a long time, even after they get tired. Straight leg raises, side-lying This exercise strengthens the muscles that rotate the leg at the hip and move it away from your body (hip abductors). 6. Lie on your side with your left / right leg in the top position. Lie so your head, shoulder, hip, and knee line up. You may bend your bottom knee to help you balance. 7. Roll your hips slightly forward so your hips are stacked directly over each other and your left / right knee is facing forward. 8. Tense the muscles in your outer thigh and lift your top leg 4-6 inches (10-15  cm). 9. Hold this position for __________ seconds. 10. Slowly return to the starting position. Let your muscles relax completely before doing another repetition. Repeat __________ times. Complete this exercise __________ times a day. Leg raises, prone This exercise strengthens the muscles that move the hips (hip extensors). 5. Lie on your abdomen on your bed or a firm surface. You can put a pillow under your hips if that is more comfortable for your lower back. 6. Bend your left / right knee so your foot is straight up in the air. 7. Squeeze your buttocks muscles and lift your left / right thigh off the bed. Do not let your back arch. 8. Tense your thigh muscle as hard as you can without increasing any knee pain. 9. Hold this position for __________ seconds. 10. Slowly lower your leg to the starting position and allow it to relax completely. Repeat __________ times. Complete this exercise __________ times a day. Hip hike 6. Stand sideways on a bottom step. Stand on your left / right leg with your other foot unsupported next to the step. You can hold on to the railing or wall for balance if needed. 7. Keep your knees straight and your torso square. Then lift your left / right hip up toward the ceiling. 8. Slowly let your left / right hip lower toward the floor, past the starting position. Your foot should get closer to the floor. Do not lean or bend your knees. Repeat __________ times. Complete this exercise __________ times a day. This information is not intended to replace advice given to you by your health care provider. Make sure you discuss any questions you have with your health care provider. Document Revised: 01/25/2019 Document Reviewed: 07/26/2018 Elsevier Patient Education  2020 [employer].

## 2020-05-15 ENCOUNTER — Ambulatory Visit: Payer: BC Managed Care – PPO | Admitting: Rheumatology

## 2020-05-31 ENCOUNTER — Other Ambulatory Visit: Payer: Self-pay | Admitting: Obstetrics & Gynecology

## 2020-06-02 NOTE — Telephone Encounter (Signed)
Medication refill request: Micronor Last AEX:  06/27/19 SM Next AEX: 09/02/20 Last MMG (if hormonal medication request): n/a Refill authorized: today, please advise

## 2020-06-05 ENCOUNTER — Ambulatory Visit: Payer: BC Managed Care – PPO | Admitting: Rheumatology

## 2020-09-02 ENCOUNTER — Ambulatory Visit: Payer: BC Managed Care – PPO | Admitting: Obstetrics & Gynecology

## 2020-09-04 IMAGING — US US ABDOMEN COMPLETE
1 series · 13 of 25 positions shown · non-contrast
Comparison: CT abdomen and pelvis January 24, 2019

CLINICAL DATA: Abdominal pain, primarily left-sided

EXAM:
ABDOMEN ULTRASOUND COMPLETE

[Series 1: us abdomen complete · 13 of 62 slices shown]
[im 1/62]
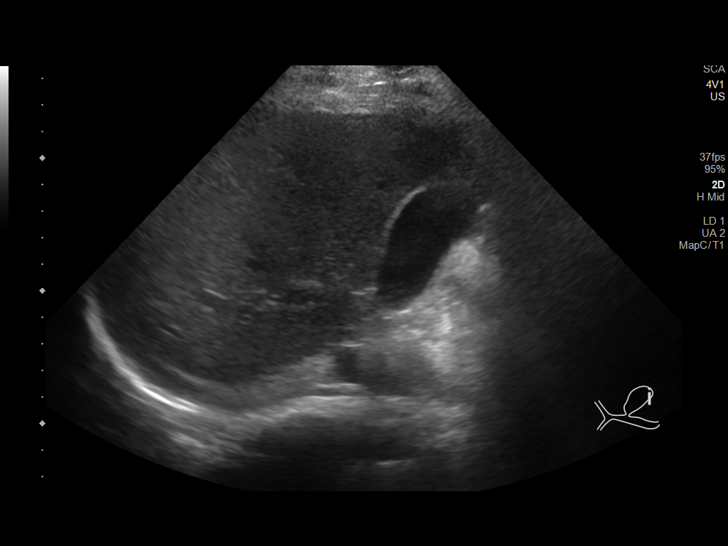
[im 6/62]
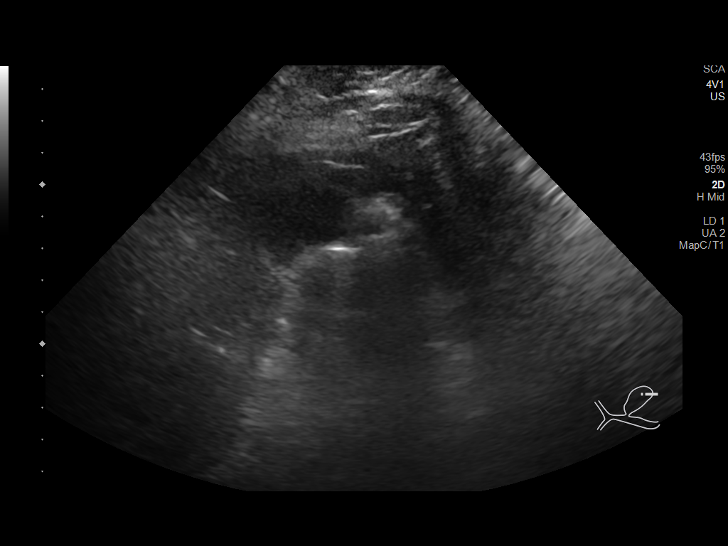
[im 11/62]
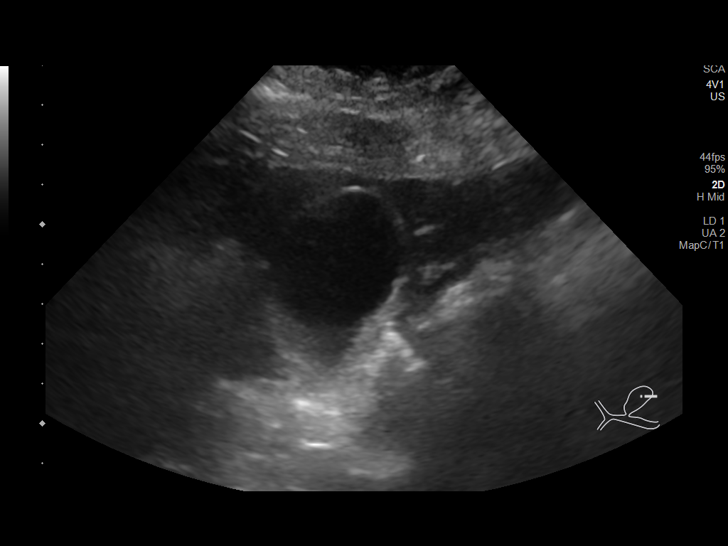
[im 16/62]
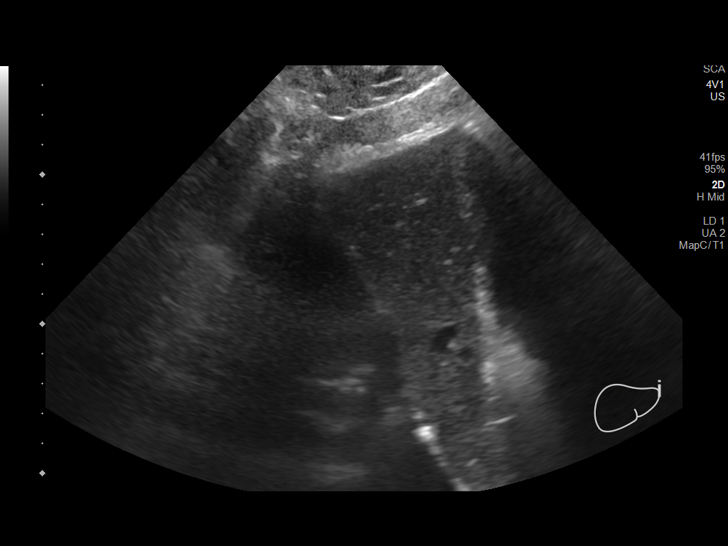
[im 21/62]
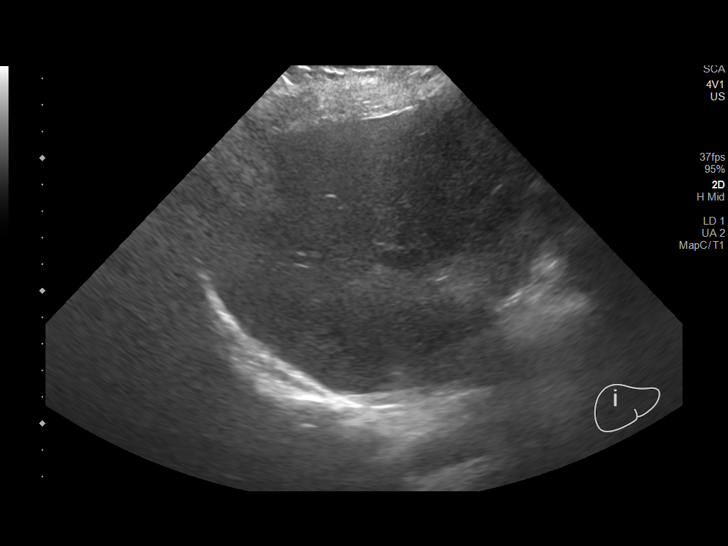
[im 26/62]
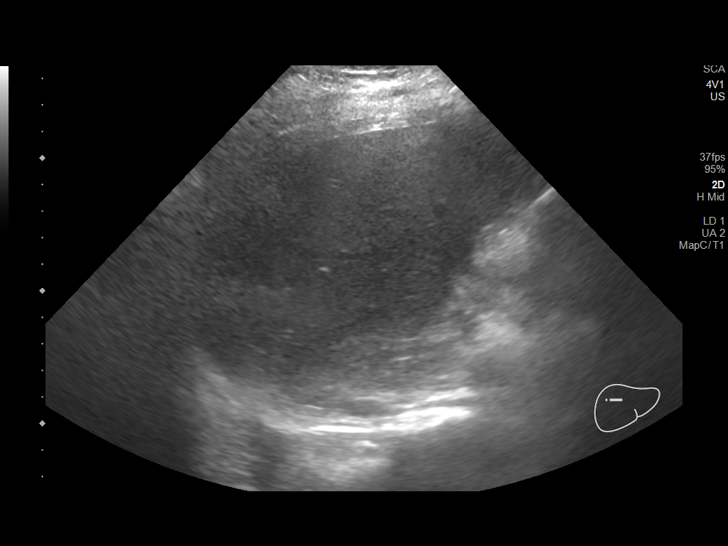
[im 31/62]
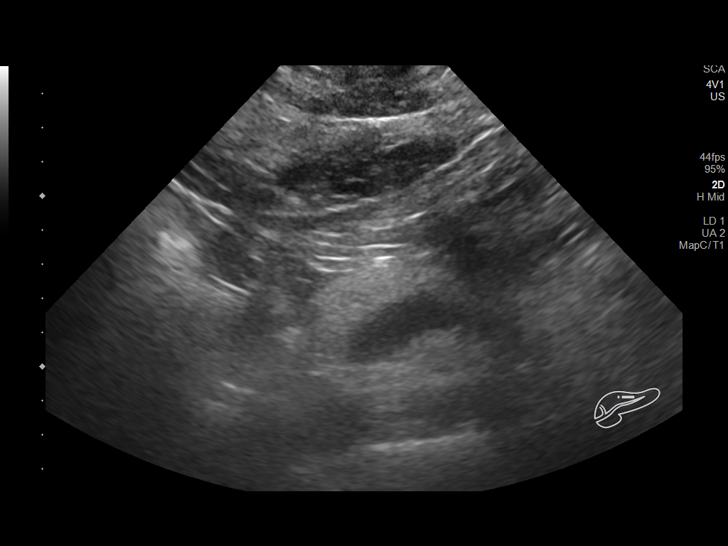
[im 36/62]
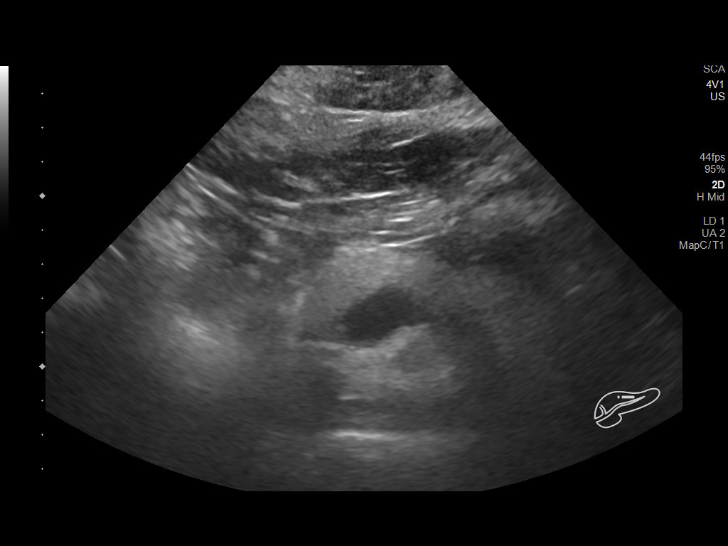
[im 41/62]
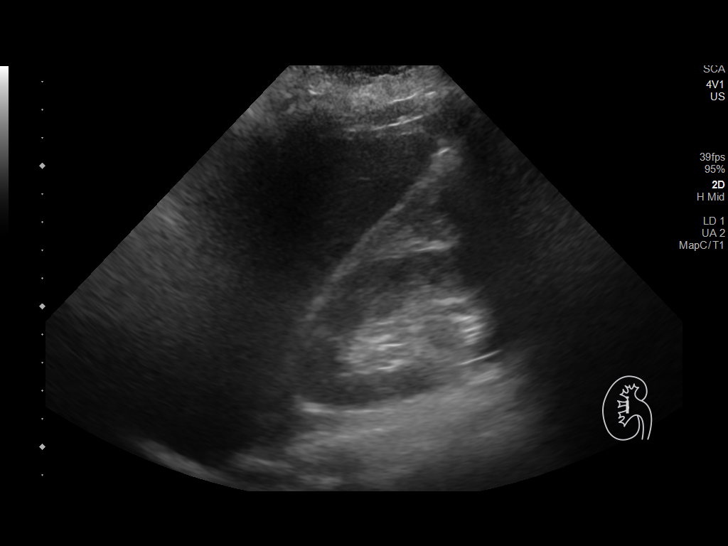
[im 46/62]
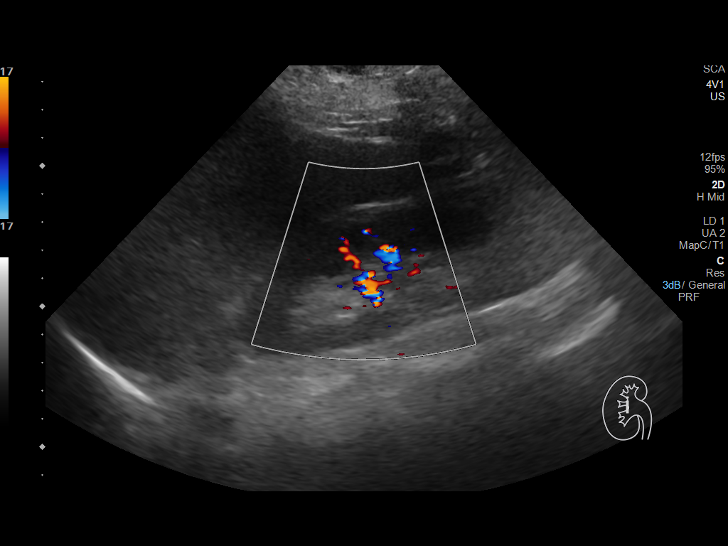
[im 51/62]
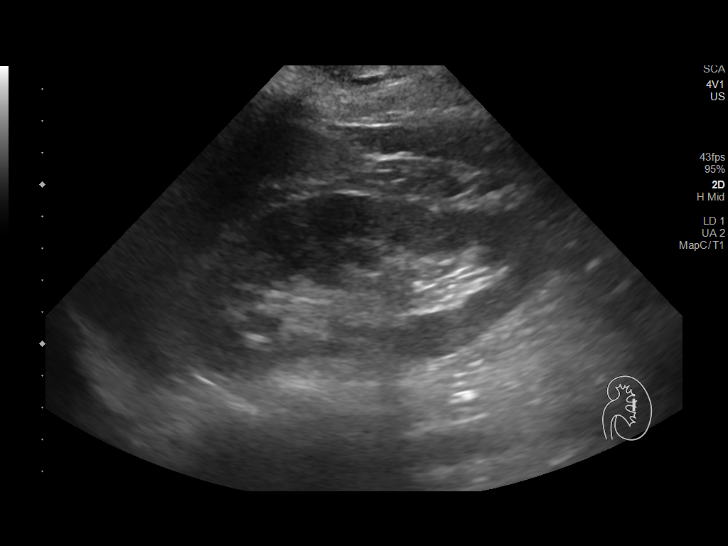
[im 56/62]
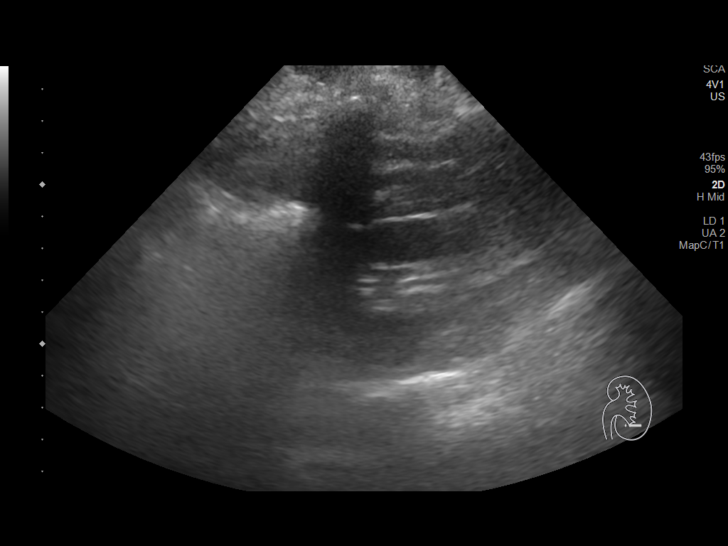
[im 62/62]
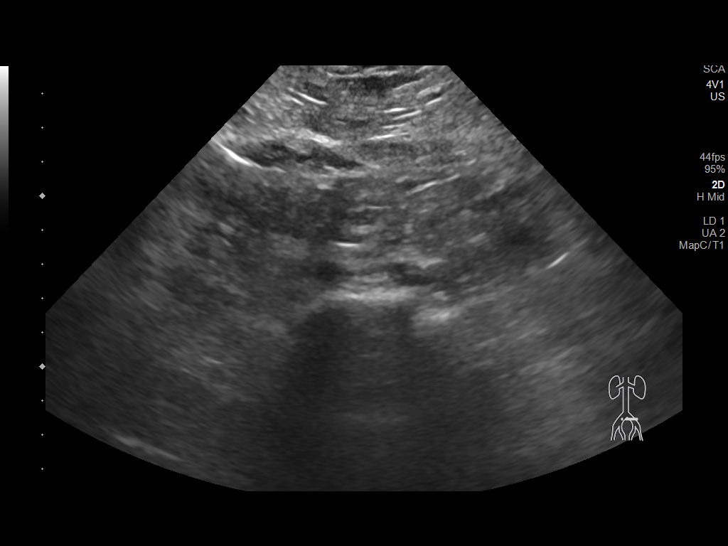

[13 of 25 positions shown; findings below may reference images not displayed]

FINDINGS: Gallbladder: No gallstones or wall thickening visualized. There is
no pericholecystic fluid. No sonographic Murphy sign noted by
sonographer.

Common bile duct: Diameter: 3 mm. No intrahepatic, common hepatic,
or common bile duct dilatation.

Liver: No focal lesion identified. Liver echogenicity overall is
mildly increased. Portal vein is patent on color Doppler imaging
with normal direction of blood flow towards the liver.

IVC: No abnormality visualized.

Pancreas: No pancreatic mass or inflammatory focus.

Spleen: Size and appearance within normal limits. Spleen measures
11.4 x 10.6 x 5.7 cm with a measured splenic volume of 363 mL.

Right Kidney: Length: 11.4 cm. Echogenicity within normal limits. No
mass or hydronephrosis visualized.

Left Kidney: Length: 10.8 cm. Echogenicity within normal limits. No
mass or hydronephrosis visualized.

Abdominal aorta: No aneurysm visualized.

Other findings: No demonstrable ascites.
IMPRESSION: 1. Increased liver echogenicity, a finding indicative of a degree of
hepatic steatosis. No focal liver lesions evident.

2.  Study otherwise unremarkable.

## 2021-04-15 NOTE — Progress Notes (Signed)
Office Visit Note  Patient: Erica Aguirre             Date of Birth: 1995-11-22           MRN: [id number]             PCP: Esperanza Richters, PA-C Referring: Marisue Brooklyn Visit Date: 04/29/2021 Occupation: @GUAROCC @  Subjective:  Pain in multiple joint.   History of Present Illness: Erica Aguirre is a 25 y.o. female with a history of positive ANA and myofascial pain syndrome.  She states she went to physical therapy after her last visit which was not very helpful.  She continues to have insomnia and fatigue.  She also gives history of oral ulcers and dry mouth.  She continues to have Raynaud's phenomenon.  She complains of some discomfort in her wrist and her knee joints.  She has not notices swelling in her knee joints and her ankles.   Patient reports morning stiffness for 40 minutes.   Patient Reports nocturnal pain.  Difficulty dressing/grooming: Denies Difficulty climbing stairs: Reports Difficulty getting out of chair: Reports Difficulty using hands for taps, buttons, cutlery, and/or writing: Denies  Review of Systems  Constitutional:  Positive for fatigue.  HENT:  Positive for mouth sores and mouth dryness. Negative for nose dryness.   Eyes:  Positive for itching. Negative for dryness.  Respiratory:  Negative for shortness of breath and difficulty breathing.   Cardiovascular:  Positive for chest pain. Negative for palpitations.  Gastrointestinal:  Negative for blood in stool, constipation and diarrhea.  Endocrine: Negative for increased urination.  Genitourinary:  Negative for difficulty urinating.  Musculoskeletal:  Positive for joint pain, joint pain, myalgias, morning stiffness, muscle tenderness and myalgias. Negative for joint swelling.  Skin:  Positive for color change. Negative for rash, redness and sensitivity to sunlight.  Allergic/Immunologic: Positive for susceptible to infections.  Neurological:  Positive for dizziness, numbness, headaches and weakness.  Negative for memory loss.  Hematological:  Positive for bruising/bleeding tendency. Negative for swollen glands.  Psychiatric/Behavioral:  Positive for sleep disturbance. Negative for depressed mood and confusion. The patient is not nervous/anxious.    PMFS History:  Patient Active Problem List   Diagnosis Date Noted   Viral gastroenteritis 12/25/2018   Upper respiratory tract infection 12/25/2018   IUD (intrauterine device) in place 10/15/2016   Status post lumbar spine operation 11/28/2015   Closed wedge fracture of lumbar vertebra (HCC) 10/24/2015   DUB (dysfunctional uterine bleeding) 05/22/2015   Migraine with aura 03/20/2015   Headache, migraine 10/21/2014    Past Medical History:  Diagnosis Date   Hypertension    h/o-no medication needed   Migraines    will occ have aura-   Obesity     Family History  Problem Relation Age of Onset   Hypertension Mother        and maternal side   Diabetes Mother        ? if diag or pre-diabetes   Gestational diabetes Mother    Migraines Mother        as teenager   Colon polyps Mother    Clotting disorder Mother    Irritable bowel syndrome Mother    Liver cancer Mother    Hypertension Father        and paternal side   Migraines Father    Diabetes Maternal Grandmother    Breast cancer Maternal Grandmother    Stroke Maternal Grandfather    Stroke Paternal Grandmother    Asthma  Brother        sports induced when younger   Asperger's syndrome Brother    Breast cancer Maternal Great-grandmother    Ovarian cancer Maternal Great-grandmother    Uterine cancer Maternal Great-grandmother    Pancreatic cancer Cousin        on mother's side    Uterine cancer Cousin        on mother's side   Colon cancer Neg Hx    Esophageal cancer Neg Hx    Past Surgical History:  Procedure Laterality Date   BACK SURGERY  12/01/2015    L3-L5 fusion done at [facility]    WISDOM TOOTH EXTRACTION     Social History   Social History Narrative    Not on file   Immunization History  Administered Date(s) Administered   Tdap 06/27/2019     Objective: Vital Signs: BP 134/84 (BP Location: Left Arm, Patient Position: Sitting, Cuff Size: Normal)   Pulse 77   Ht 5\' 8"  (1.727 m)   Wt 258 lb 3.2 oz (117.1 kg)   BMI 39.26 kg/m    Physical Exam Vitals and nursing note reviewed.  Constitutional:      Appearance: She is well-developed.  HENT:     Head: Normocephalic and atraumatic.  Eyes:     Conjunctiva/sclera: Conjunctivae normal.  Cardiovascular:     Rate and Rhythm: Normal rate and regular rhythm.     Heart sounds: Normal heart sounds.  Pulmonary:     Effort: Pulmonary effort is normal.     Breath sounds: Normal breath sounds.  Abdominal:     General: Bowel sounds are normal.     Palpations: Abdomen is soft.  Musculoskeletal:     Cervical back: Normal range of motion.  Lymphadenopathy:     Cervical: No cervical adenopathy.  Skin:    General: Skin is warm and dry.     Capillary Refill: Capillary refill takes less than 2 seconds.  Neurological:     Mental Status: She is alert and oriented to person, place, and time.  Psychiatric:        Behavior: Behavior normal.     Musculoskeletal Exam: C-spine was in good range of motion.  Shoulder joints, elbow joints, wrist joints, MCPs PIPs and DIPs with good range of motion with no synovitis.  Hip joints, knee joints, ankles, MTPs and PIPs with good range of motion with no synovitis.  CDAI Exam: CDAI Score: -- Patient Global: --; Provider Global: -- Swollen: --; Tender: -- Joint Exam 04/29/2021   No joint exam has been documented for this visit   There is currently no information documented on the homunculus. Go to the Rheumatology activity and complete the homunculus joint exam.  Investigation: No additional findings.  Imaging: No results found.  Recent Labs: Lab Results  Component Value Date   WBC 7.3 02/01/2020   HGB 14.3 02/01/2020   PLT 225.0 02/01/2020    NA 138 02/01/2020   K 4.3 02/01/2020   CL 105 02/01/2020   CO2 24 02/01/2020   GLUCOSE 84 02/01/2020   BUN 16 02/01/2020   CREATININE 0.64 02/01/2020   BILITOT 0.4 02/01/2020   ALKPHOS 58 02/01/2020   AST 20 02/01/2020   ALT 32 02/01/2020   PROT 7.0 04/04/2020   ALBUMIN 4.4 02/01/2020   CALCIUM 9.0 02/01/2020   GFRAA >60 01/23/2019   06/02/2021 sed rate 7, TSH normal CBC normal, CMP normal, UA negative,  January 2022 CK 41, uric acid 4.5  February 01, 2020 ANA 1: 63  speciality Comments: No specialty comments available.  Procedures:  No procedures performed Allergies: Metronidazole, Cefprozil, Contrast media [iodinated diagnostic agents], and Other   Assessment / Plan:     Visit Diagnoses: Positive ANA (antinuclear antibody) - patient had low titer positive ANA in the past.  Her ENA and C3-C4 were normal.  She complains of fatigue, oral ulcers, Raynaud's phenomenon, dry mouth, joint pain, joint swelling.  No synovitis was noted on my examination today.  No rashes are noted.  She had no nailbed capillary changes.  She had good capillary refill and no sclerodactyly.  I will obtain AVISE labs. If the labs are abnormal we will bring her back for the follow-up visit otherwise we will discuss results.  Vitamin D deficiency-she has been taking vitamin D 1000 units daily.  I advised her to increase vitamin D to 2000 units daily.  Trochanteric bursitis of both hips-IT band stretches were discussed.  Myofascial pain - referred to integrative therapies at the last visit.  Patient went to physical therapy in [place].  She does states that it was beneficial.  She has been walking on a daily basis.  Use of water aerobics and stretching was emphasized.  Other insomnia-good sleep hygiene was discussed.  Other fatigue-related to insomnia and fibromyalgia.  Intractable migraine with aura without status migrainosus-she continues to have migraines.  IUD (intrauterine device) in place  DUB  (dysfunctional uterine bleeding)  Neuropathy  Orders: No orders of the defined types were placed in this encounter.  No orders of the defined types were placed in this encounter.    Follow-Up Instructions: Return in about 1 year (around 04/29/2022) for +ANA, MFPS.   Pollyann Savoy, MD  Note - This record has been created using Animal nutritionist.  Chart creation errors have been sought, but may not always  have been located. Such creation errors do not reflect on  the standard of medical care.

## 2021-04-29 ENCOUNTER — Ambulatory Visit: Payer: BC Managed Care – PPO | Admitting: Rheumatology

## 2021-04-29 ENCOUNTER — Other Ambulatory Visit: Payer: Self-pay

## 2021-04-29 ENCOUNTER — Encounter: Payer: Self-pay | Admitting: Rheumatology

## 2021-04-29 VITALS — BP 134/84 | HR 77 | Ht 68.0 in | Wt 258.2 lb

## 2021-04-29 DIAGNOSIS — R768 Other specified abnormal immunological findings in serum: Secondary | ICD-10-CM

## 2021-04-29 DIAGNOSIS — M7918 Myalgia, other site: Secondary | ICD-10-CM

## 2021-04-29 DIAGNOSIS — G43119 Migraine with aura, intractable, without status migrainosus: Secondary | ICD-10-CM

## 2021-04-29 DIAGNOSIS — E559 Vitamin D deficiency, unspecified: Secondary | ICD-10-CM | POA: Diagnosis not present

## 2021-04-29 DIAGNOSIS — G629 Polyneuropathy, unspecified: Secondary | ICD-10-CM

## 2021-04-29 DIAGNOSIS — N938 Other specified abnormal uterine and vaginal bleeding: Secondary | ICD-10-CM

## 2021-04-29 DIAGNOSIS — G4709 Other insomnia: Secondary | ICD-10-CM

## 2021-04-29 DIAGNOSIS — M7062 Trochanteric bursitis, left hip: Secondary | ICD-10-CM

## 2021-04-29 DIAGNOSIS — Z975 Presence of (intrauterine) contraceptive device: Secondary | ICD-10-CM

## 2021-04-29 DIAGNOSIS — M7061 Trochanteric bursitis, right hip: Secondary | ICD-10-CM | POA: Diagnosis not present

## 2021-04-29 DIAGNOSIS — R5383 Other fatigue: Secondary | ICD-10-CM

## 2021-08-17 ENCOUNTER — Other Ambulatory Visit: Payer: Self-pay

## 2021-08-17 ENCOUNTER — Encounter (HOSPITAL_BASED_OUTPATIENT_CLINIC_OR_DEPARTMENT_OTHER): Payer: Self-pay | Admitting: Obstetrics & Gynecology

## 2021-08-17 ENCOUNTER — Ambulatory Visit (HOSPITAL_BASED_OUTPATIENT_CLINIC_OR_DEPARTMENT_OTHER): Payer: BC Managed Care – PPO | Admitting: Obstetrics & Gynecology

## 2021-08-17 VITALS — BP 129/90 | HR 112 | Ht 68.0 in | Wt 249.8 lb

## 2021-08-17 DIAGNOSIS — Z30432 Encounter for removal of intrauterine contraceptive device: Secondary | ICD-10-CM | POA: Diagnosis not present

## 2021-08-17 DIAGNOSIS — G43119 Migraine with aura, intractable, without status migrainosus: Secondary | ICD-10-CM

## 2021-08-17 DIAGNOSIS — G43109 Migraine with aura, not intractable, without status migrainosus: Secondary | ICD-10-CM

## 2021-08-17 DIAGNOSIS — Z8742 Personal history of other diseases of the female genital tract: Secondary | ICD-10-CM

## 2021-08-17 MED ORDER — TRANEXAMIC ACID 650 MG PO TABS: 1300.0000 mg | ORAL_TABLET | Freq: Three times a day (TID) | ORAL | 2 refills | Status: DC

## 2021-08-17 NOTE — Progress Notes (Signed)
25 y.o. G63P0000 Single Caucasian female presents for removal of Kyleena IUD.  Does not want new IUD placed.  Having back pain and other issues. Trying to figure out if any of this is related.  Had Kyleena placed 05/13/2016.    Not SA.  Doesn't need anything else for contraception.   H/o menorrhagia.  H/o migraines with aura.  Discussed possible TXA use to see if will help with cycles.  Dosing and side effects discussed.  Pt has hx migraines with aura and cannot take estrogen containing OCPs.    Neg pap 06/2019.    LMP:  Patient's last menstrual period was 08/14/2021 (exact date).  Patient Active Problem List   Diagnosis Date Noted   Viral gastroenteritis 12/25/2018   Upper respiratory tract infection 12/25/2018   IUD (intrauterine device) in place 10/15/2016   Status post lumbar spine operation 11/28/2015   Closed wedge fracture of lumbar vertebra (HCC) 10/24/2015   DUB (dysfunctional uterine bleeding) 05/22/2015   Migraine with aura 03/20/2015   Headache, migraine 10/21/2014   Past Medical History:  Diagnosis Date   ANA positive    Hypertension    h/o-no medication needed   Migraines    will occ have aura-   myofascial pain syndrome    Obesity    Current Outpatient Medications on File Prior to Visit  Medication Sig Dispense Refill   acetaminophen (TYLENOL) 325 MG tablet Take by mouth as needed.      AIMOVIG 140 MG/ML SOAJ SMARTSIG:140 Milligram(s) SUB-Q Once a Month     baclofen (LIORESAL) 20 MG tablet Take 20 mg by mouth daily.      Cholecalciferol (VITAMIN D3) 20 MCG (800 UNIT) TABS Take 1 tablet by mouth daily.     DULoxetine (CYMBALTA) 30 MG capsule TAKE 1 CAPSULE BY MOUTH ONCE DAILY. TAKE IT TOGETHER WITH YOUR 60MG  CAPSULE FOR A TOTAL OF 90MG  AT BEDTIME.     DULoxetine (CYMBALTA) 60 MG capsule Take 60 mg by mouth daily.     gabapentin (NEURONTIN) 300 MG capsule Take 600 mg by mouth daily.      ibuprofen (ADVIL,MOTRIN) 200 MG tablet Take 200 mg by mouth as needed.       lamoTRIgine (LAMICTAL) 200 MG tablet Take 1 tablet by mouth daily.     Magnesium 250 MG TABS Take 2 tablets by mouth daily.      Multiple Vitamin (MULTIVITAMIN) tablet Take 1 tablet by mouth daily.     naratriptan (AMERGE) 2.5 MG tablet Take by mouth as needed.     OZEMPIC, 0.25 OR 0.5 MG/DOSE, 2 MG/1.5ML SOPN SMARTSIG:0.25 Milligram(s) SUB-Q Once a Week     UBRELVY 100 MG TABS Take by mouth as needed.     No current facility-administered medications on file prior to visit.   Metronidazole, Cefprozil, Contrast media [iodinated diagnostic agents], and Other  Review of Systems  Constitutional: Negative.   Gastrointestinal: Negative.   Genitourinary: Negative.   Vitals:   08/17/21 0919  BP: 129/90  Pulse: (!) 112  Weight: 249 lb 12.8 oz (113.3 kg)  Height: 5\' 8"  (1.727 m)    Gen:  WNWF healthy female NAD Abdomen: soft, non-tender Groin:  no inguinal nodes palpated  Pelvic exam: Vulva:  normal female genitalia Vagina:  normal vagina Cervix:  Non-tender, Negative CMT, no lesions or redness. Uterus:  normal shape, position and consistency   Procedure:  Speculum reinserted.  Cervix visualized and cleansed with Betadine x 3.  IUD string noted and grasped with ringed  forcep.  With one pull, IUD removed easily.  Pt has some mild cramping but tolerated this well.  Assessment/Plan: 1. Encounter for IUD removal - s/p removal today  2. Migraine with aura and without status migrainosus, not intractable  3. History of menorrhagia - tranexamic acid (LYSTEDA) 650 MG TABS tablet; Take 2 tablets (1,300 mg total) by mouth 3 (three) times daily.  Dispense: 30 tablet; Refill: 2 - recheck 3 months.  Has AEX scheduled 10/2021 so no other appt needed.

## 2021-08-20 ENCOUNTER — Encounter (HOSPITAL_BASED_OUTPATIENT_CLINIC_OR_DEPARTMENT_OTHER): Payer: Self-pay | Admitting: Obstetrics & Gynecology

## 2021-10-21 ENCOUNTER — Encounter (HOSPITAL_BASED_OUTPATIENT_CLINIC_OR_DEPARTMENT_OTHER): Payer: Self-pay | Admitting: Obstetrics & Gynecology

## 2021-10-26 ENCOUNTER — Other Ambulatory Visit (HOSPITAL_BASED_OUTPATIENT_CLINIC_OR_DEPARTMENT_OTHER): Payer: Self-pay | Admitting: Obstetrics & Gynecology

## 2021-10-26 MED ORDER — DIAZEPAM 5 MG PO TABS: ORAL_TABLET | ORAL | 0 refills | Status: DC

## 2021-10-26 MED ORDER — MISOPROSTOL 200 MCG PO TABS: ORAL_TABLET | ORAL | 0 refills | Status: DC

## 2021-10-26 NOTE — Progress Notes (Signed)
Rx for diazepam 5mg  po 1 hour prior to procedure and cytotec 276mcg pv pm and am before procedure to pharmacy on file.

## 2021-10-28 ENCOUNTER — Encounter (HOSPITAL_BASED_OUTPATIENT_CLINIC_OR_DEPARTMENT_OTHER): Payer: Self-pay | Admitting: Obstetrics & Gynecology

## 2021-10-28 ENCOUNTER — Ambulatory Visit (INDEPENDENT_AMBULATORY_CARE_PROVIDER_SITE_OTHER): Payer: BC Managed Care – PPO | Admitting: Obstetrics & Gynecology

## 2021-10-28 ENCOUNTER — Other Ambulatory Visit: Payer: Self-pay

## 2021-10-28 ENCOUNTER — Other Ambulatory Visit (HOSPITAL_COMMUNITY)
Admission: RE | Admit: 2021-10-28 | Discharge: 2021-10-28 | Disposition: A | Discharge status: Home / Self Care | Payer: BC Managed Care – PPO | Source: Ambulatory Visit | Attending: Obstetrics & Gynecology | Admitting: Obstetrics & Gynecology

## 2021-10-28 VITALS — BP 111/79 | HR 104 | Ht 68.0 in | Wt 241.1 lb

## 2021-10-28 DIAGNOSIS — Z30430 Encounter for insertion of intrauterine contraceptive device: Secondary | ICD-10-CM

## 2021-10-28 DIAGNOSIS — Z01419 Encounter for gynecological examination (general) (routine) without abnormal findings: Secondary | ICD-10-CM

## 2021-10-28 DIAGNOSIS — Z124 Encounter for screening for malignant neoplasm of cervix: Secondary | ICD-10-CM | POA: Insufficient documentation

## 2021-10-28 DIAGNOSIS — Z113 Encounter for screening for infections with a predominantly sexual mode of transmission: Secondary | ICD-10-CM | POA: Insufficient documentation

## 2021-10-28 DIAGNOSIS — Z3202 Encounter for pregnancy test, result negative: Secondary | ICD-10-CM

## 2021-10-28 DIAGNOSIS — G43119 Migraine with aura, intractable, without status migrainosus: Secondary | ICD-10-CM

## 2021-10-28 LAB — POCT URINE PREGNANCY: Preg Test, Ur: NEGATIVE

## 2021-10-28 MED ORDER — LEVONORGESTREL 19.5 MG IU IUD: INTRAUTERINE_SYSTEM | Freq: Once | INTRAUTERINE | Status: AC

## 2021-10-28 NOTE — Progress Notes (Signed)
26 y.o. G0P0000 Single White or Caucasian female here for annual exam.  Desires IUD placement again today.  Had this removed in 2022 when it was due for removal.  Wanted to see what cycles were like with IUD out and she now realizes they were much better.  Did try TXA and this helped but prefers the IUD.  Would like placement today.  LMP 10/18/2021.  Has not been SA since prior to menstrual cycle.  UPT negative today.    Sexually active: Yes.    The current method of family planning is none.    Exercising: No.  Smoker:  no  Health Maintenance: Pap:  06/27/2019 Negative History of abnormal Pap:  no MMG:  guidelines reviewed Colonoscopy:  guidelines reviewed Screening Labs: none today   reports that she has never smoked. She has never used smokeless tobacco. She reports that she does not drink alcohol and does not use drugs.  Past Medical History:  Diagnosis Date   ANA positive    Hypertension    h/o-no medication needed   Migraines    will occ have aura-   myofascial pain syndrome    Obesity     Past Surgical History:  Procedure Laterality Date   BACK SURGERY  12/01/2015    L3-L5 fusion done at [facility] TOOTH EXTRACTION      Current Outpatient Medications  Medication Sig Dispense Refill   acetaminophen (TYLENOL) 325 MG tablet Take by mouth as needed.      AIMOVIG 140 MG/ML SOAJ SMARTSIG:140 Milligram(s) SUB-Q Once a Month     baclofen (LIORESAL) 20 MG tablet Take 20 mg by mouth daily.      Cholecalciferol (VITAMIN D3) 20 MCG (800 UNIT) TABS Take 1 tablet by mouth daily.     diazepam (VALIUM) 5 MG tablet Take one tab 60 minutes prior to procedure.  May repeat in 1 hour if needed. 2 tablet 0   DULoxetine (CYMBALTA) 30 MG capsule TAKE 1 CAPSULE BY MOUTH ONCE DAILY. TAKE IT TOGETHER WITH YOUR 60MG  CAPSULE FOR A TOTAL OF 90MG  AT BEDTIME.     DULoxetine (CYMBALTA) 60 MG capsule Take 60 mg by mouth daily.     gabapentin (NEURONTIN) 300 MG capsule Take 600 mg by mouth  daily.      ibuprofen (ADVIL,MOTRIN) 200 MG tablet Take 200 mg by mouth as needed.      lamoTRIgine (LAMICTAL) 200 MG tablet Take 1 tablet by mouth daily.     Magnesium 250 MG TABS Take 2 tablets by mouth daily.      misoprostol (CYTOTEC) 200 MCG tablet Place tab pv pm and am before procedure 2 tablet 0   Multiple Vitamin (MULTIVITAMIN) tablet Take 1 tablet by mouth daily.     naratriptan (AMERGE) 2.5 MG tablet Take by mouth as needed.     tranexamic acid (LYSTEDA) 650 MG TABS tablet Take 2 tablets (1,300 mg total) by mouth 3 (three) times daily. 30 tablet 2   UBRELVY 100 MG TABS Take by mouth as needed.     No current facility-administered medications for this visit.    Family History  Problem Relation Age of Onset   Hypertension Mother        and maternal side   Diabetes Mother        ? if diag or pre-diabetes   Gestational diabetes Mother    Migraines Mother        as teenager   Colon polyps Mother  Clotting disorder Mother    Irritable bowel syndrome Mother    Liver cancer Mother    Hypertension Father        and paternal side   Migraines Father    Diabetes Maternal Grandmother    Breast cancer Maternal Grandmother    Stroke Maternal Grandfather    Stroke Paternal Grandmother    Asthma Brother        sports induced when younger   Asperger's syndrome Brother    Breast cancer Maternal Great-grandmother    Ovarian cancer Maternal Great-grandmother    Uterine cancer Maternal Great-grandmother    Pancreatic cancer Cousin        on mother's side    Uterine cancer Cousin        on mother's side   Colon cancer Neg Hx    Esophageal cancer Neg Hx     Review of Systems  All other systems reviewed and are negative.  Exam:   BP 111/79 (BP Location: Right Arm, Patient Position: Sitting, Cuff Size: Large)    Pulse (!) 104    Ht 5\' 8"  (1.727 m) Comment: reported   Wt 241 lb 1.6 oz (109.4 kg)    LMP 10/18/2021 (Approximate)    BMI 36.66 kg/m   Height: 5\' 8"  (172.7 cm)  (reported)  General appearance: alert, cooperative and appears stated age Head: Normocephalic, without obvious abnormality, atraumatic Neck: no adenopathy, supple, symmetrical, trachea midline and thyroid normal to inspection and palpation Lungs: clear to auscultation bilaterally Breasts: normal appearance, no masses or tenderness Heart: regular rate and rhythm Abdomen: soft, non-tender; bowel sounds normal; no masses,  no organomegaly Extremities: extremities normal, atraumatic, no cyanosis or edema Skin: Skin color, texture, turgor normal. No rashes or lesions Lymph nodes: Cervical, supraclavicular, and axillary nodes normal. No abnormal inguinal nodes palpated Neurologic: Grossly normal   Pelvic: External genitalia:  no lesions              Urethra:  normal appearing urethra with no masses, tenderness or lesions              Bartholins and Skenes: normal                 Vagina: normal appearing vagina with normal color and no discharge, no lesions              Cervix: no lesions              Pap taken: Yes.   Bimanual Exam:  Uterus:  normal size, contour, position, consistency, mobility, non-tender              Adnexa: normal adnexa and no mass, fullness, tenderness               Rectovaginal: Confirms               Anus:  normal sphincter tone, no lesions  Procedure:  Speculum reinserted.  Cervix visualized and cleansed with Betadine x 3.  Paracervical block was not placed.  Single toothed tenaculum applied to anterior lip of cervix without difficulty.  Uterus sounded to 7.5cm.  Lot number: [id number].  Expiration:  03/2023.  IUD package was opened.  IUD and introducer passed to fundus and then withdrawn slightly before IUD was passed into endometrial cavity.  Introducer removed.  Strings cut to 2cm.  Tenaculum removed from cervix.  Minimal bleeding noted.  Pt tolerated the procedure well.  All instruments removed from vagina.  Chaperone, Ina Homesonya [place], CMA, was present  for  exam.  Assessment/Plan: 1. Well woman exam with routine gynecological exam - pap smear obtained today - breast and colon cancer screening guidelines reviewed - vaccines reviewed/updated  2. Cervical cancer screening - Cytology - PAP( [place])  3. Screening examination for STD (sexually transmitted disease) - GC/Chl obtained today  4. Encounter for IUD insertion - POCT urine pregnancy - levonorgestrel (KYLEENA) 19.5 MG IUD - return for recheck 6-8 weeks.  Pt knows to call for any concerns - Pt aware removal due no later than 10/28/2026.  IUD card given to pt.  5. Intractable migraine with aura without status migrainosus

## 2021-11-02 LAB — CYTOLOGY - PAP
Chlamydia: NEGATIVE
Comment: NEGATIVE
Comment: NEGATIVE
Comment: NORMAL
Diagnosis: UNDETERMINED — AB
High risk HPV: NEGATIVE
Neisseria Gonorrhea: NEGATIVE

## 2021-11-19 DIAGNOSIS — H5310 Unspecified subjective visual disturbances: Secondary | ICD-10-CM | POA: Diagnosis not present

## 2021-11-30 DIAGNOSIS — G43719 Chronic migraine without aura, intractable, without status migrainosus: Secondary | ICD-10-CM | POA: Diagnosis not present

## 2021-11-30 DIAGNOSIS — G4452 New daily persistent headache (NDPH): Secondary | ICD-10-CM | POA: Diagnosis not present

## 2021-12-09 ENCOUNTER — Encounter (HOSPITAL_BASED_OUTPATIENT_CLINIC_OR_DEPARTMENT_OTHER): Payer: Self-pay | Admitting: Obstetrics & Gynecology

## 2021-12-09 ENCOUNTER — Other Ambulatory Visit: Payer: Self-pay

## 2021-12-09 ENCOUNTER — Ambulatory Visit (HOSPITAL_BASED_OUTPATIENT_CLINIC_OR_DEPARTMENT_OTHER): Payer: BC Managed Care – PPO | Admitting: Obstetrics & Gynecology

## 2021-12-09 VITALS — BP 126/74 | HR 93 | Ht 68.0 in | Wt 252.4 lb

## 2021-12-09 DIAGNOSIS — Z30431 Encounter for routine checking of intrauterine contraceptive device: Secondary | ICD-10-CM | POA: Diagnosis not present

## 2021-12-09 NOTE — Progress Notes (Signed)
26 y.o. G0P0000 Single Caucasian female presents for followed up after insertion of Kyleena IUD on 10/28/2021.  Pt reports she is doing well and is only having some dark spotting that she sees primarily with wiping.  Not currently SA.  Denies pelvic pain.  She did have cramping after insertion but that has improved.     LMP:  No LMP recorded. (Menstrual status: IUD).  Patient Active Problem List   Diagnosis Date Noted   IUD (intrauterine device) in place 10/15/2016   Status post lumbar spine operation 11/28/2015   Closed wedge fracture of lumbar vertebra (HCC) 10/24/2015   DUB (dysfunctional uterine bleeding) 05/22/2015   Migraine with aura 03/20/2015   Past Medical History:  Diagnosis Date   ANA positive    Hypertension    h/o-no medication needed   Migraines    will occ have aura-   myofascial pain syndrome    Obesity    Current Outpatient Medications on File Prior to Visit  Medication Sig Dispense Refill   acetaminophen (TYLENOL) 325 MG tablet Take by mouth as needed.      AIMOVIG 140 MG/ML SOAJ SMARTSIG:140 Milligram(s) SUB-Q Once a Month     baclofen (LIORESAL) 20 MG tablet Take 20 mg by mouth daily.      Cholecalciferol (VITAMIN D3) 20 MCG (800 UNIT) TABS Take 1 tablet by mouth daily.     DULoxetine (CYMBALTA) 30 MG capsule TAKE 1 CAPSULE BY MOUTH ONCE DAILY. TAKE IT TOGETHER WITH YOUR 60MG  CAPSULE FOR A TOTAL OF 90MG  AT BEDTIME.     DULoxetine (CYMBALTA) 60 MG capsule Take 60 mg by mouth daily.     gabapentin (NEURONTIN) 300 MG capsule Take 600 mg by mouth daily.      ibuprofen (ADVIL,MOTRIN) 200 MG tablet Take 200 mg by mouth as needed.      lamoTRIgine (LAMICTAL) 200 MG tablet Take 1 tablet by mouth daily.     Magnesium 250 MG TABS Take 2 tablets by mouth daily.      Multiple Vitamin (MULTIVITAMIN) tablet Take 1 tablet by mouth daily.     naratriptan (AMERGE) 2.5 MG tablet Take by mouth as needed.     UBRELVY 100 MG TABS Take by mouth as needed.     No current  facility-administered medications on file prior to visit.   Metronidazole, Cefprozil, Contrast media [iodinated contrast media], and Other  Review of Systems  All other systems reviewed and are negative. Vitals:   12/09/21 0934  BP: 126/74  Pulse: 93  Weight: 252 lb 6.4 oz (114.5 kg)  Height: 5\' 8"  (1.727 m)    Gen:  WNWF healthy female NAD Abdomen: soft, non-tender Groin:  no inguinal nodes palpated  Pelvic exam: Vulva:  normal female genitalia Vagina:  normal vagina Cervix:  Non-tender, Negative CMT, no lesions or redness.  IUD string 2c,.   Uterus:  normal shape, position and consistency    Assessment/Plan: 1. IUD check up - pt reassured about spotting.  Advised to call if still having spotting after 3 months from insertion - Pt knows to call with any new concerns or problems.

## 2021-12-16 DIAGNOSIS — M797 Fibromyalgia: Secondary | ICD-10-CM | POA: Diagnosis not present

## 2021-12-30 DIAGNOSIS — G43719 Chronic migraine without aura, intractable, without status migrainosus: Secondary | ICD-10-CM | POA: Diagnosis not present

## 2022-01-25 DIAGNOSIS — G43719 Chronic migraine without aura, intractable, without status migrainosus: Secondary | ICD-10-CM | POA: Diagnosis not present

## 2022-03-05 ENCOUNTER — Other Ambulatory Visit (HOSPITAL_COMMUNITY): Payer: Self-pay | Admitting: Internal Medicine

## 2022-03-05 ENCOUNTER — Other Ambulatory Visit: Payer: Self-pay | Admitting: Internal Medicine

## 2022-03-05 DIAGNOSIS — R1084 Generalized abdominal pain: Secondary | ICD-10-CM

## 2022-03-05 DIAGNOSIS — K2961 Other gastritis with bleeding: Secondary | ICD-10-CM

## 2022-04-21 NOTE — Progress Notes (Deleted)
Office Visit Note  Patient: Erica Aguirre             Date of Birth: 05-Jan-1996           MRN: [id number]             PCP: Enid Baas, MD Referring: Marisue Brooklyn Visit Date: 04/28/2022 Occupation: @GUAROCC @  Subjective:  No chief complaint on file.   History of Present Illness: Erica Aguirre is a 26 y.o. female ***   Activities of Daily Living:  Patient reports morning stiffness for *** {minute/hour:19697}.   Patient {ACTIONS;DENIES/REPORTS:21021675::"Denies"} nocturnal pain.  Difficulty dressing/grooming: {ACTIONS;DENIES/REPORTS:21021675::"Denies"} Difficulty climbing stairs: {ACTIONS;DENIES/REPORTS:21021675::"Denies"} Difficulty getting out of chair: {ACTIONS;DENIES/REPORTS:21021675::"Denies"} Difficulty using hands for taps, buttons, cutlery, and/or writing: {ACTIONS;DENIES/REPORTS:21021675::"Denies"}  No Rheumatology ROS completed.   PMFS History:  Patient Active Problem List   Diagnosis Date Noted  . IUD (intrauterine device) in place 10/15/2016  . Status post lumbar spine operation 11/28/2015  . Closed wedge fracture of lumbar vertebra (HCC) 10/24/2015  . DUB (dysfunctional uterine bleeding) 05/22/2015  . Migraine with aura 03/20/2015    Past Medical History:  Diagnosis Date  . ANA positive   . Hypertension    h/o-no medication needed  . Migraines    will occ have aura-  . myofascial pain syndrome   . Obesity     Family History  Problem Relation Age of Onset  . Hypertension Mother        and maternal side  . Diabetes Mother        ? if diag or pre-diabetes  . Gestational diabetes Mother   . Migraines Mother        as teenager  . Colon polyps Mother   . Clotting disorder Mother   . Irritable bowel syndrome Mother   . Liver cancer Mother   . Hypertension Father        and paternal side  . Migraines Father   . Diabetes Maternal Grandmother   . Breast cancer Maternal Grandmother   . Stroke Maternal Grandfather   . Stroke Paternal  Grandmother   . Asthma Brother        sports induced when younger  . Asperger's syndrome Brother   . Breast cancer Maternal Great-grandmother   . Ovarian cancer Maternal Great-grandmother   . Uterine cancer Maternal Great-grandmother   . Pancreatic cancer Cousin        on mother's side   . Uterine cancer Cousin        on mother's side  . Colon cancer Neg Hx   . Esophageal cancer Neg Hx    Past Surgical History:  Procedure Laterality Date  . BACK SURGERY  12/01/2015    L3-L5 fusion done at [facility]   . WISDOM TOOTH EXTRACTION     Social History   Social History Narrative  . Not on file   Immunization History  Administered Date(s) Administered  . PPD Test 02/23/2021, 02/23/2021  . Tdap 06/27/2019     Objective: Vital Signs: There were no vitals taken for this visit.   Physical Exam   Musculoskeletal Exam: ***  CDAI Exam: CDAI Score: -- Patient Global: --; Provider Global: -- Swollen: --; Tender: -- Joint Exam 04/28/2022   No joint exam has been documented for this visit   There is currently no information documented on the homunculus. Go to the Rheumatology activity and complete the homunculus joint exam.  Investigation: No additional findings.  Imaging: No results found.  Recent Labs: Lab Results  Component Value Date   WBC 7.3 02/01/2020   HGB 14.3 02/01/2020   PLT 225.0 02/01/2020   NA 138 02/01/2020   K 4.3 02/01/2020   CL 105 02/01/2020   CO2 24 02/01/2020   GLUCOSE 84 02/01/2020   BUN 16 02/01/2020   CREATININE 0.64 02/01/2020   BILITOT 0.4 02/01/2020   ALKPHOS 58 02/01/2020   AST 20 02/01/2020   ALT 32 02/01/2020   PROT 7.0 04/04/2020   ALBUMIN 4.4 02/01/2020   CALCIUM 9.0 02/01/2020   GFRAA >60 01/23/2019    Speciality Comments: No specialty comments available.  Procedures:  No procedures performed Allergies: Metronidazole, Cefprozil, Contrast media [iodinated contrast media], and Other   Assessment / Plan:     Visit  Diagnoses: No diagnosis found.  Orders: No orders of the defined types were placed in this encounter.  No orders of the defined types were placed in this encounter.   Face-to-face time spent with patient was *** minutes. Greater than 50% of time was spent in counseling and coordination of care.  Follow-Up Instructions: No follow-ups on file.   Ellen Henri, CMA  Note - This record has been created using Animal nutritionist.  Chart creation errors have been sought, but may not always  have been located. Such creation errors do not reflect on  the standard of medical care.

## 2022-04-28 ENCOUNTER — Ambulatory Visit: Payer: Self-pay | Admitting: Rheumatology

## 2022-04-28 DIAGNOSIS — M7918 Myalgia, other site: Secondary | ICD-10-CM

## 2022-04-28 DIAGNOSIS — G629 Polyneuropathy, unspecified: Secondary | ICD-10-CM

## 2022-04-28 DIAGNOSIS — M7062 Trochanteric bursitis, left hip: Secondary | ICD-10-CM

## 2022-04-28 DIAGNOSIS — E559 Vitamin D deficiency, unspecified: Secondary | ICD-10-CM

## 2022-04-28 DIAGNOSIS — G4709 Other insomnia: Secondary | ICD-10-CM

## 2022-04-28 DIAGNOSIS — N938 Other specified abnormal uterine and vaginal bleeding: Secondary | ICD-10-CM

## 2022-04-28 DIAGNOSIS — Z975 Presence of (intrauterine) contraceptive device: Secondary | ICD-10-CM

## 2022-04-28 DIAGNOSIS — R768 Other specified abnormal immunological findings in serum: Secondary | ICD-10-CM

## 2022-04-28 DIAGNOSIS — R5383 Other fatigue: Secondary | ICD-10-CM

## 2022-04-28 DIAGNOSIS — G43119 Migraine with aura, intractable, without status migrainosus: Secondary | ICD-10-CM

## 2022-05-28 ENCOUNTER — Other Ambulatory Visit: Payer: Self-pay | Admitting: Internal Medicine

## 2022-05-28 DIAGNOSIS — R1084 Generalized abdominal pain: Secondary | ICD-10-CM

## 2022-09-02 ENCOUNTER — Encounter: Payer: No Typology Code available for payment source | Attending: Internal Medicine | Admitting: Dietician

## 2022-09-02 ENCOUNTER — Encounter: Payer: Self-pay | Admitting: Dietician

## 2022-09-02 VITALS — Ht 68.0 in | Wt 284.1 lb

## 2022-09-02 DIAGNOSIS — Z6841 Body Mass Index (BMI) 40.0 and over, adult: Secondary | ICD-10-CM

## 2022-09-02 DIAGNOSIS — Z713 Dietary counseling and surveillance: Secondary | ICD-10-CM | POA: Diagnosis not present

## 2022-09-02 DIAGNOSIS — G43119 Migraine with aura, intractable, without status migrainosus: Secondary | ICD-10-CM

## 2022-09-02 NOTE — Progress Notes (Signed)
Medical Nutrition Therapy: Visit start time: 0900  end time: 1000  Assessment:   Referral Diagnosis: obesity Other medical history/ diagnoses: chronic migraine Psychosocial issues/ stress concerns: sometimes high stress level, feels she is managing stress fairly well  Medications, supplements: reconciled list in medical record   Preferred learning method:  Visual Hands-on    Current weight: 284.1lbs with sweatshirt, shoes Height: 5'8" BMI: 43.2 Patient's personal weight goal: about 165lbs  Progress and evaluation:  Patient reports health issues for the past 2 years. 2021 ill with fever of unknown origin, lasted for 3 months, followed by multiple URIs, other viruses, fibromyalgia.  Feels drained, lethargic most days. Is now sick less often, but when sick has more severe symptoms. Has had thyroid testing in the past with no abnormality. Immune testing has had varying results.  Reports challenge in eating at consistent times; making healthy choices consistently. Feels she is often "restarting" healthy habits and unable to maintain Reports soda is a challenge, relies on caffeine but would like to decrease; does not like coffee or tea Food allergies: abdominal bloating when eating gluten, avoids glute    Dietary Intake:  Usual eating pattern includes 1-3 meals and 0-1 snacks per day. Dining out frequency: 2 meals per week. Who plans meals/ buys groceries? Self, mother Who prepares meals? Self, mother  Breakfast: oatmeal when home; at work egg [employer]: none Lunch: sometimes skips due to work/ schoolwork// not hungry -- [place] burger/ tuna/ chicken leftovers Snack: usually none Supper: time varies often 6-7 (does not eat if later) -- chicken/ meat + 1-2 veg + sometimes starch  Snack: none Beverages: 6-7 glasses water, 2-3 sodas, sometimes sugar free  Physical activity: consistent exercise is difficult; walks on lunch breaks   Intervention:   Nutrition Care Education:    Basic nutrition: basic food groups; appropriate nutrient balance; appropriate meal and snack schedule; general nutrition guidelines    Weight control: importance of low sugar and low fat choices; appropriate food portions; estimated energy needs for weight loss at 1800 kcal, provided guidance for 45% CHO, 25% pro, 30% fat; benefits of eating at 3-5 hour intervals and avoiding long hours during the day without nourishment; strategies for making changes ie sugary beverages/ caffeine Advanced nutrition: cooking techniques-- meal prepping Other: gluten free food choices to allow for some carb intake for increased energy  Other intervention notes: Patient voices readiness to make changes, although struggling to maintain change. Focused discussion on sustainable options. Established goals with direction from patient. She declined scheduling follow up visit at this time but will schedule later if needed.   Nutritional Diagnosis:  [place]-1.4 Altered GI function As related to abdominal bloating when consuming gluten containing foods.  As evidenced by patient reported symptoms with resolution when avoiding gluten. [place]-3.3 Overweight/obesity As related to history of limited activity due to ilnesses and migraines, erratic eating pattern, history of overweight.  As evidenced by patient with current BMI of 43.   Education Materials given:  Designer, industrial/product with food lists, sample meal pattern Get Healthy with Mediterranean Style Eating Snacking handout Visit summary with goals/ instructions to be viewed via patient portal   Learner/ who was taught:  Patient   Level of understanding: Verbalizes/ demonstrates competency   Demonstrated degree of understanding via:   Teach back Learning barriers: None  Willingness to learn/ readiness for change: Acceptance, ready for change   Monitoring and Evaluation:  Dietary intake, exercise, GI symptoms, energy, and body weight      follow  up: prn

## 2022-09-02 NOTE — Patient Instructions (Addendum)
Try prepping bento box type lunches several days in advance. Include a protein, a starch, and a veg and/or fruit for best nutrition. If work hours run late, eat at least a snack or light meal in the evening, including a small portion of a protein + one other food group.  Choose a soda (preferably sugar free) that is lower in caffeine or blend a caffeine free with a regular soda.  Continue with exercise as many days as possible.  Great job making healthy food choices, keep up the whole, clean foods as much as possible.  Call for follow up as needed.

## 2022-11-01 ENCOUNTER — Ambulatory Visit (HOSPITAL_BASED_OUTPATIENT_CLINIC_OR_DEPARTMENT_OTHER): Payer: No Typology Code available for payment source | Admitting: Obstetrics & Gynecology

## 2022-11-01 ENCOUNTER — Encounter (HOSPITAL_BASED_OUTPATIENT_CLINIC_OR_DEPARTMENT_OTHER): Payer: Self-pay | Admitting: Obstetrics & Gynecology

## 2022-11-01 VITALS — BP 122/82 | HR 120 | Ht 68.0 in | Wt 291.0 lb

## 2022-11-01 DIAGNOSIS — G43119 Migraine with aura, intractable, without status migrainosus: Secondary | ICD-10-CM | POA: Diagnosis not present

## 2022-11-01 DIAGNOSIS — Z975 Presence of (intrauterine) contraceptive device: Secondary | ICD-10-CM

## 2022-11-01 DIAGNOSIS — Z01419 Encounter for gynecological examination (general) (routine) without abnormal findings: Secondary | ICD-10-CM | POA: Diagnosis not present

## 2022-11-01 DIAGNOSIS — Z83719 Family history of colon polyps, unspecified: Secondary | ICD-10-CM

## 2022-11-01 NOTE — Progress Notes (Signed)
27 y.o. G0P0000 Single White or Caucasian female here for annual exam.  Has Verdia Kuba iUD that was placed 10/28/2021.  Cycles are regular and she has bleeding.  First day is light.  Day three is the heaviest but the last two days are light again.  Lasts about 5 days.    Saw rheumatologist at [facility].  Has positive ANA but other testing was negative.  Otherwise, the evaluation has been non-diagnostic.  Specialists agree "something is there" but she doesn't have a diagnosis.  Has been on cymbalta.  Increasing this dosage didn't really help.  No LMP recorded. (Menstrual status: IUD).          Sexually active: Yes.    The current method of family planning is abstinence.    Exercising: No.   Smoker:  no  Health Maintenance:  Pap:  10/28/2021 ASCUS with neg HR HPV History of abnormal Pap:  no MMG:  guidelines reviewed Colonoscopy:  guidelines reviewed Screening Labs: reviewed in [place]   reports that she has never smoked. She has never used smokeless tobacco. She reports that she does not drink alcohol and does not use drugs.  Past Medical History:  Diagnosis Date   ANA positive    Hypertension    h/o-no medication needed   Migraines    will occ have aura-   myofascial pain syndrome    Obesity     Past Surgical History:  Procedure Laterality Date   BACK SURGERY  12/01/2015    L3-L5 fusion done at [place]      Current Outpatient Medications  Medication Sig Dispense Refill   acetaminophen (TYLENOL) 325 MG tablet Take by mouth as needed.     AIMOVIG 140 MG/ML SOAJ SMARTSIG:140 Milligram(s) SUB-Q Once a Month     baclofen (LIORESAL) 20 MG tablet Take 20 mg by mouth daily.      Cholecalciferol (VITAMIN D3) 20 MCG (800 UNIT) TABS Take 1 tablet by mouth daily.     DULoxetine (CYMBALTA) 60 MG capsule Take 60 mg by mouth daily.     gabapentin (NEURONTIN) 300 MG capsule Take 600 mg by mouth daily.      ibuprofen (ADVIL,MOTRIN) 200 MG tablet Take 200 mg by  mouth as needed.      lamoTRIgine (LAMICTAL) 200 MG tablet Take 1 tablet by mouth daily.     Magnesium 250 MG TABS Take 2 tablets by mouth daily.      Multiple Vitamin (MULTIVITAMIN) tablet Take 1 tablet by mouth daily.     naratriptan (AMERGE) 2.5 MG tablet Take by mouth as needed.     No current facility-administered medications for this visit.    Family History  Problem Relation Age of Onset   Hypertension Mother        and maternal side   Diabetes Mother        ? if diag or pre-diabetes   Gestational diabetes Mother    Migraines Mother        as teenager   Colon polyps Mother    Clotting disorder Mother    Irritable bowel syndrome Mother    Liver cancer Mother    Hypertension Father        and paternal side   Migraines Father    Diabetes Maternal Grandmother    Breast cancer Maternal Grandmother    Stroke Maternal Grandfather    Stroke Paternal Grandmother    Asthma Brother  sports induced when younger   Asperger's syndrome Brother    Breast cancer Maternal Great-grandmother    Ovarian cancer Maternal Great-grandmother    Uterine cancer Maternal Great-grandmother    Pancreatic cancer Cousin        on mother's side    Uterine cancer Cousin        on mother's side   Colon cancer Neg Hx    Esophageal cancer Neg Hx     ROS: Constitutional: negative Genitourinary:negative  Exam:   BP 122/82   Pulse (!) 120   Ht 5\' 8"  (1.727 m) Comment: Reported  Wt 291 lb (132 kg)   BMI 44.25 kg/m   Height: 5\' 8"  (172.7 cm) (Reported)  General appearance: alert, cooperative and appears stated age Head: Normocephalic, without obvious abnormality, atraumatic Neck: no adenopathy, supple, symmetrical, trachea midline and thyroid normal to inspection and palpation Lungs: clear to auscultation bilaterally Breasts: normal appearance, no masses or tenderness Heart: regular rate and rhythm Abdomen: soft, non-tender; bowel sounds normal; no masses,  no  organomegaly Extremities: extremities normal, atraumatic, no cyanosis or edema Skin: Skin color, texture, turgor normal. No rashes or lesions Lymph nodes: Cervical, supraclavicular, and axillary nodes normal. No abnormal inguinal nodes palpated Neurologic: Grossly normal   Pelvic: External genitalia:  no lesions              Urethra:  normal appearing urethra with no masses, tenderness or lesions              Bartholins and Skenes: normal                 Vagina: normal appearing vagina with normal color and no discharge, no lesions              Cervix: no lesions              Pap taken: No., IUD string noted Bimanual Exam:  Uterus:  normal size, contour, position, consistency, mobility, non-tender              Adnexa: normal adnexa and no mass, fullness, tenderness               Rectovaginal: Confirms               Anus:  normal sphincter tone, no lesions  Chaperone, Octaviano Batty, CMA, was present for exam.  Assessment/Plan: 1. Well woman exam with routine gynecological exam - Pap smear 10/2021-- was ASCUS with neg HR HPV.  Guidelines for repeating discussed - Mammogram guidelines reviewed - Colonoscopy guidelines reviewed - lab work done with Dr. Tressia Miners at [facility] reviewed/updated  2. Family history of polyps in the colon -  mother diagnosed in her 67's  3. Intractable migraine with aura without status migrainosus - neurologist was at [facility] but he retired.  Will likely be seen back at [facility].  4. IUD (intrauterine device) in place - placement 10/2021

## 2023-11-14 ENCOUNTER — Ambulatory Visit (HOSPITAL_BASED_OUTPATIENT_CLINIC_OR_DEPARTMENT_OTHER): Payer: No Typology Code available for payment source | Admitting: Obstetrics & Gynecology

## 2023-12-13 ENCOUNTER — Encounter (HOSPITAL_BASED_OUTPATIENT_CLINIC_OR_DEPARTMENT_OTHER): Payer: Self-pay | Admitting: Obstetrics & Gynecology

## 2023-12-13 ENCOUNTER — Ambulatory Visit (HOSPITAL_BASED_OUTPATIENT_CLINIC_OR_DEPARTMENT_OTHER): Payer: Managed Care, Other (non HMO) | Admitting: Obstetrics & Gynecology

## 2023-12-13 VITALS — BP 137/84 | HR 96 | Ht 68.5 in | Wt 303.4 lb

## 2023-12-13 DIAGNOSIS — Z975 Presence of (intrauterine) contraceptive device: Secondary | ICD-10-CM | POA: Diagnosis not present

## 2023-12-13 DIAGNOSIS — Z01419 Encounter for gynecological examination (general) (routine) without abnormal findings: Secondary | ICD-10-CM | POA: Diagnosis not present

## 2023-12-13 DIAGNOSIS — G43119 Migraine with aura, intractable, without status migrainosus: Secondary | ICD-10-CM

## 2023-12-13 NOTE — Progress Notes (Signed)
 ANNUAL EXAM Patient name: Erica Aguirre MRN [id number]  Date of birth: 02/21/1996 Chief Complaint:   Gynecologic Exam  History of Present Illness:   Erica Aguirre is a 28 y.o. G0P0000 Caucasian female being seen today for a routine annual exam.  Has IUD for contraception/bleeding control.  She has spotting typically about once a month.  Erica Aguirre placed 10/28/2021.  Reminded pt of 5 year indication.    Not currently SA.    No LMP recorded. (Menstrual status: IUD).   Upstream - 12/13/23 1351       Pregnancy Intention Screening   Does the patient want to become pregnant in the next year? No    Does the patient's partner want to become pregnant in the next year? No    Would the patient like to discuss contraceptive options today? No      Contraception Wrap Up   Current Method IUD or IUS             Last pap 10/28/21. Results were: ASCUS w/ HRHPV negative. H/O abnormal pap: no Last mammogram: not indicated yet.  Family h/o breast cancer: no Last colonoscopy: guidelines reviewed. Family h/o colorectal cancer: no     12/13/2023    1:51 PM 11/01/2022    8:39 AM 09/02/2022    9:11 AM 12/09/2021    9:35 AM 10/28/2021    9:31 AM  Depression screen PHQ 2/9  Decreased Interest 0 0 0 0 0  Down, Depressed, Hopeless 0 0 0 0 0  PHQ - 2 Score 0 0 0 0 0         No data to display           Review of Systems:   Pertinent items are noted in HPI Denies any headaches, blurred vision, fatigue, shortness of breath, chest pain, abdominal pain, abnormal vaginal discharge/itching/odor/irritation, problems with periods, bowel movements, urination, or intercourse unless otherwise stated above. Pertinent History Reviewed:  Reviewed past medical,surgical, social and family history.  Reviewed problem list, medications and allergies. Physical Assessment:   Vitals:   12/13/23 1346  BP: 137/84  Pulse: 96  Weight: (!) 303 lb 6.4 oz (137.6 kg)  Height: 5' 8.5" (1.74 m)  Body mass index is  45.46 kg/m.        Physical Examination:   General appearance - well appearing, and in no distress  Mental status - alert, oriented to person, place, and time  Psych:  She has a normal mood and affect  Skin - warm and dry, normal color, no suspicious lesions noted  Chest - effort normal, all lung fields clear to auscultation bilaterally  Heart - normal rate and regular rhythm  Neck:  midline trachea, no thyromegaly or nodules  Breasts - breasts appear normal, no suspicious masses, no skin or nipple changes or  axillary nodes  Abdomen - soft, nontender, nondistended, no masses or organomegaly  Pelvic - VULVA: normal appearing vulva with no masses, tenderness or lesions   VAGINA: normal appearing vagina with normal color and discharge, no lesions   CERVIX: normal appearing cervix without discharge or lesions, no CMT  Thin prep pap is not done.  UTERUS: uterus is felt to be normal size, shape, consistency and nontender   ADNEXA: No adnexal masses or tenderness noted.  Rectal - deferred  Extremities:  No swelling or varicosities noted  Chaperone present for exam, Erica Ache, RN.  No results found for this or any previous visit (from the past 24 hours).  Assessment &  Plan:  1. Well woman exam with routine gynecological exam (Primary) - Pap smear ASCUS with neg HR HPV in 2023.  Will repeat next year. - Mammogram guidelines reviewed.  - Colonoscopy guidelines reviewed. - lab work done with PCP, Erica Aguirre - vaccines reviewed/updated  2. IUD (intrauterine device) in place - Erica Aguirre placed 2023  3. Intractable migraine with aura without status migrainosus    Meds: No orders of the defined types were placed in this encounter.   Follow-up: Return in about 1 year (around 12/12/2024).  Erica Shaggy, Erica Aguirre 12/13/2023 2:16 PM

## 2024-12-14 ENCOUNTER — Ambulatory Visit (HOSPITAL_BASED_OUTPATIENT_CLINIC_OR_DEPARTMENT_OTHER): Payer: Managed Care, Other (non HMO) | Admitting: Obstetrics & Gynecology
